# Patient Record
Sex: Female | Born: 1990 | Race: White | Hispanic: No | Marital: Married | Smoking: Never smoker
Health system: Southern US, Community
[De-identification: ages and names within clinical notes are randomized; demographics above are authoritative.]

## PROBLEM LIST (undated history)

## (undated) DIAGNOSIS — IMO0001 Reserved for inherently not codable concepts without codable children: Secondary | ICD-10-CM

## (undated) DIAGNOSIS — O039 Complete or unspecified spontaneous abortion without complication: Secondary | ICD-10-CM

## (undated) HISTORY — PX: MULTIPLE TOOTH EXTRACTIONS: SHX2053

---

## 2013-10-07 ENCOUNTER — Emergency Department (HOSPITAL_COMMUNITY)
Admission: EM | Admit: 2013-10-07 | Discharge: 2013-10-08 | Disposition: A | Discharge status: Home / Self Care | Payer: BC Managed Care – PPO | Attending: Emergency Medicine | Admitting: Emergency Medicine

## 2013-10-07 ENCOUNTER — Encounter (HOSPITAL_COMMUNITY): Payer: Self-pay | Admitting: Emergency Medicine

## 2013-10-07 DIAGNOSIS — N938 Other specified abnormal uterine and vaginal bleeding: Secondary | ICD-10-CM | POA: Insufficient documentation

## 2013-10-07 DIAGNOSIS — N949 Unspecified condition associated with female genital organs and menstrual cycle: Secondary | ICD-10-CM | POA: Insufficient documentation

## 2013-10-07 DIAGNOSIS — A499 Bacterial infection, unspecified: Secondary | ICD-10-CM | POA: Insufficient documentation

## 2013-10-07 DIAGNOSIS — Z3202 Encounter for pregnancy test, result negative: Secondary | ICD-10-CM | POA: Insufficient documentation

## 2013-10-07 DIAGNOSIS — N760 Acute vaginitis: Secondary | ICD-10-CM | POA: Insufficient documentation

## 2013-10-07 DIAGNOSIS — B9689 Other specified bacterial agents as the cause of diseases classified elsewhere: Secondary | ICD-10-CM | POA: Insufficient documentation

## 2013-10-07 DIAGNOSIS — N939 Abnormal uterine and vaginal bleeding, unspecified: Secondary | ICD-10-CM

## 2013-10-07 HISTORY — DX: Reserved for inherently not codable concepts without codable children: IMO0001

## 2013-10-07 HISTORY — DX: Complete or unspecified spontaneous abortion without complication: O03.9

## 2013-10-07 NOTE — ED Notes (Signed)
Patient is alert and oriented x3.  She states that she has been bleeding since she had an abortion on Oct 31.  She states that she is having cramping pains with nausea and dizziness.  Currently she rates her pain 7 of 10.  Today she passed a large mass that she has brought with her.

## 2013-10-08 ENCOUNTER — Emergency Department (HOSPITAL_COMMUNITY): Payer: BC Managed Care – PPO

## 2013-10-08 LAB — CBC WITH DIFFERENTIAL/PLATELET
Basophils Absolute: 0 10*3/uL (ref 0.0–0.1)
Basophils Relative: 1 % (ref 0–1)
Eosinophils Relative: 6 % — ABNORMAL HIGH (ref 0–5)
HCT: 38 % (ref 36.0–46.0)
Hemoglobin: 13.1 g/dL (ref 12.0–15.0)
Lymphocytes Relative: 34 % (ref 12–46)
Lymphs Abs: 2 10*3/uL (ref 0.7–4.0)
MCV: 88 fL (ref 78.0–100.0)
Monocytes Absolute: 0.6 10*3/uL (ref 0.1–1.0)
Monocytes Relative: 10 % (ref 3–12)
Neutro Abs: 3 10*3/uL (ref 1.7–7.7)
RBC: 4.32 MIL/uL (ref 3.87–5.11)
RDW: 12.3 % (ref 11.5–15.5)
WBC: 6 10*3/uL (ref 4.0–10.5)

## 2013-10-08 LAB — WET PREP, GENITAL
Trich, Wet Prep: NONE SEEN
Yeast Wet Prep HPF POC: NONE SEEN

## 2013-10-08 LAB — POCT PREGNANCY, URINE: Preg Test, Ur: NEGATIVE

## 2013-10-08 MED ORDER — METRONIDAZOLE 500 MG PO TABS
500.0000 mg | ORAL_TABLET | Freq: Once | ORAL | Status: AC
  Administered 2013-10-08: 500 mg via ORAL
  Filled 2013-10-08: qty 1

## 2013-10-08 MED ORDER — METRONIDAZOLE 500 MG PO TABS: 500.0000 mg | ORAL_TABLET | Freq: Two times a day (BID) | ORAL | Status: DC

## 2013-10-08 NOTE — ED Provider Notes (Signed)
CSN: [id number]     Arrival date & time 10/07/13  2210 History   First MD Initiated Contact with Patient 10/08/13 0121     Chief Complaint  Patient presents with  . Vaginal Bleeding   (Consider location/radiation/quality/duration/timing/severity/associated sxs/prior Treatment) HPI Comments: Patient had a therapeutic abortion on October 31 at that time.  She has had intermittent episodes of heavy bleeding.  Waxing and waning in intensity, but never totally, without bleeding was unable to keep her followup visit at 6 weeks.  She has an appointment in January.  Today, she developed severe abdominal cramping.  Has a large clumps of tissue, and the bleeding has stopped as well as the cramping.  Patient denies dizziness, shortness of breath with exertion, resumption of sexual intercourse, dysuria, vomiting, or diarrhea  Patient is a 22 y.o. female presenting with vaginal bleeding. The history is provided by the patient.  Vaginal Bleeding Quality:  Passed tissue and lighter than menses Severity:  Moderate Onset quality:  Gradual Duration:  6 weeks Timing:  Constant Progression:  Waxing and waning Chronicity:  New Menstrual history:  Regular Possible pregnancy: no   Context comment:  After therapeutic abortion October 31 Relieved by:  None tried Worsened by:  Nothing tried Ineffective treatments:  None tried Associated symptoms: abdominal pain   Associated symptoms: no fever and no nausea   Risk factors: terminated pregnancy   Risk factors: no STD and no STD exposure     Past Medical History  Diagnosis Date  . Abortion    History reviewed. No pertinent past surgical history. No family history on file. History  Substance Use Topics  . Smoking status: Never Smoker   . Smokeless tobacco: Not on file  . Alcohol Use: Yes   OB History   Grav Para Term Preterm Abortions TAB SAB Ect Mult Living                 Review of Systems  Constitutional: Negative for fever.  Respiratory:  Negative for shortness of breath.   Gastrointestinal: Positive for abdominal pain. Negative for nausea and vomiting.  Genitourinary: Positive for vaginal bleeding.  Musculoskeletal: Negative for myalgias.  All other systems reviewed and are negative.    Allergies  Review of patient's allergies indicates no known allergies.  Home Medications   Current Outpatient Rx  Name  Route  Sig  Dispense  Refill  . ibuprofen (ADVIL,MOTRIN) 200 MG tablet   Oral   Take 400 mg by mouth every 6 (six) hours as needed (pain).          BP 115/76  Pulse 85  Temp(Src) 98.3 F (36.8 C) (Oral)  Resp 18  Ht 5\' 3"  (1.6 m)  Wt 123 lb (55.792 kg)  BMI 21.79 kg/m2  SpO2 99%  LMP 09/17/2013 Physical Exam  Nursing note and vitals reviewed. Constitutional: She is oriented to person, place, and time. She appears well-developed and well-nourished.  HENT:  Head: Normocephalic.  Eyes: Pupils are equal, round, and reactive to light.  Neck: Normal range of motion.  Abdominal: Soft. She exhibits no distension. There is no tenderness.  Genitourinary: Vagina normal and uterus normal. No vaginal discharge found.  Small amount of, what appears to be old blood brownish in color in the vaginal vault.  Cervical os is closed, nontender  Musculoskeletal: Normal range of motion.  Neurological: She is alert and oriented to person, place, and time.  Skin: Skin is warm.    ED Course  Procedures (including critical care  time) Labs Review Labs Reviewed  WET PREP, GENITAL - Abnormal; Notable for the following:    Clue Cells Wet Prep HPF POC MODERATE (*)    WBC, Wet Prep HPF POC MODERATE (*)    All other components within normal limits  CBC WITH DIFFERENTIAL - Abnormal; Notable for the following:    Eosinophils Relative 6 (*)    All other components within normal limits  GC/CHLAMYDIA PROBE AMP  POCT PREGNANCY, URINE   Imaging Review US Transvaginal Non-ob  10/08/2013   CLINICAL DATA:  Vaginal bleeding.   Prior abortion on 08/15/2013.  EXAM: TRANSABDOMINAL AND TRANSVAGINAL ULTRASOUND OF PELVIS  TECHNIQUE: Both transabdominal and transvaginal ultrasound examinations of the pelvis were performed. Transabdominal technique was performed for global imaging of the pelvis including uterus, ovaries, adnexal regions, and pelvic cul-de-sac. It was necessary to proceed with endovaginal exam following the transabdominal exam to visualize the uterus and ovaries in greater detail.  COMPARISON:  None  FINDINGS: Uterus  Measurements: 7.0 x 2.4 x 3.6 cm. No fibroids or other mass visualized.  Endometrium  Thickness: 0.4 cm.  No focal abnormality visualized.  Right ovary  Measurements: 3.2 x 1.4 x 1.9 cm. Normal appearance/no adnexal mass.  Left ovary  Measurements: 3.7 x 1.9 x 1.5 cm. Normal appearance/no adnexal mass.  Other findings  Trace free fluid is noted within the pelvic cul-de-sac.  IMPRESSION: Unremarkable pelvic ultrasound. No evidence for retained products of conception.   Electronically Signed   By: Roanna Raider M.D.   On: 10/08/2013 02:53   US Pelvis Complete  10/08/2013   CLINICAL DATA:  Vaginal bleeding.  Prior abortion on 08/15/2013.  EXAM: TRANSABDOMINAL AND TRANSVAGINAL ULTRASOUND OF PELVIS  TECHNIQUE: Both transabdominal and transvaginal ultrasound examinations of the pelvis were performed. Transabdominal technique was performed for global imaging of the pelvis including uterus, ovaries, adnexal regions, and pelvic cul-de-sac. It was necessary to proceed with endovaginal exam following the transabdominal exam to visualize the uterus and ovaries in greater detail.  COMPARISON:  None  FINDINGS: Uterus  Measurements: 7.0 x 2.4 x 3.6 cm. No fibroids or other mass visualized.  Endometrium  Thickness: 0.4 cm.  No focal abnormality visualized.  Right ovary  Measurements: 3.2 x 1.4 x 1.9 cm. Normal appearance/no adnexal mass.  Left ovary  Measurements: 3.7 x 1.9 x 1.5 cm. Normal appearance/no adnexal mass.  Other  findings  Trace free fluid is noted within the pelvic cul-de-sac.  IMPRESSION: Unremarkable pelvic ultrasound. No evidence for retained products of conception.   Electronically Signed   By: Roanna Raider M.D.   On: 10/08/2013 02:53    EKG Interpretation   None       MDM   1. Vaginal bleeding, abnormal   2. Bacterial vaginosis    Is on Flagyl recommended.  The patient.  Followup with her OB/GYN as scheduled in January    Arman Filter, NP 10/08/13 [phone]

## 2013-10-08 NOTE — ED Provider Notes (Signed)
Medical screening examination/treatment/procedure(s) were conducted as a shared visit with non-physician practitioner(s) or resident  and myself.  I personally evaluated the patient during the encounter and agree with the findings and plan unless otherwise indicated.    I have personally reviewed any xrays and/ or EKG's with the provider and I agree with interpretation.   Worsening bleeding since abortion.  No fevers.  Passed large blood clot tonight.  Mild lightheaded.  No syncope.  Mild lower abdominal cramping.  No sexual activity since abortion.  Well appearing, abd soft/ NT/ ND.   Fup outpt discussed. [place] no acute findings.  Vaginal bleeding, BV  Enid Skeens, MD 10/08/13 [phone]

## 2014-01-09 ENCOUNTER — Encounter (HOSPITAL_COMMUNITY): Payer: Self-pay | Admitting: Emergency Medicine

## 2014-01-09 ENCOUNTER — Emergency Department (HOSPITAL_COMMUNITY)
Admission: EM | Admit: 2014-01-09 | Discharge: 2014-01-10 | Disposition: A | Discharge status: Home / Self Care | Payer: BC Managed Care – PPO | Attending: Emergency Medicine | Admitting: Emergency Medicine

## 2014-01-09 ENCOUNTER — Emergency Department (HOSPITAL_COMMUNITY): Payer: BC Managed Care – PPO

## 2014-01-09 DIAGNOSIS — Y939 Activity, unspecified: Secondary | ICD-10-CM | POA: Insufficient documentation

## 2014-01-09 DIAGNOSIS — Z792 Long term (current) use of antibiotics: Secondary | ICD-10-CM | POA: Insufficient documentation

## 2014-01-09 DIAGNOSIS — S0990XA Unspecified injury of head, initial encounter: Secondary | ICD-10-CM

## 2014-01-09 DIAGNOSIS — Y929 Unspecified place or not applicable: Secondary | ICD-10-CM | POA: Insufficient documentation

## 2014-01-09 DIAGNOSIS — W64XXXA Exposure to other animate mechanical forces, initial encounter: Secondary | ICD-10-CM | POA: Insufficient documentation

## 2014-01-09 MED ORDER — ACETAMINOPHEN 325 MG PO TABS
650.0000 mg | ORAL_TABLET | Freq: Once | ORAL | Status: AC
  Administered 2014-01-09: 650 mg via ORAL
  Filled 2014-01-09: qty 2

## 2014-01-09 NOTE — ED Provider Notes (Signed)
CSN: [id number]     Arrival date & time 01/09/14  2300 History   First MD Initiated Contact with Patient 01/09/14 2335     Chief Complaint  Patient presents with  . Head Injury    HPI  Sonya Cruz is a 23 y.o. female with a PMH of abortion who presents to the ED for evaluation of head injury. History was provided by the patient.  Patient states that around 9:30 PM this evening she was kicked in the head by a horse. States she slipped in the mud and her horse took off and she subsequently got kicked. Denies LOC. Has mild nausea and a 3/10 throbbing constant headache to her forehead. Photophobia with no vision changes. No weakness, confusion, loss of sensation, numbness/tingling. No other injuries. No neck pain, chest pain, SOB, abdominal pain, emesis, back pain. Patient took an Ibuprofen PTA. Does not take blood thinners. Tetanus up to date.    Past Medical History  Diagnosis Date  . Abortion    History reviewed. No pertinent past surgical history. History reviewed. No pertinent family history. History  Substance Use Topics  . Smoking status: Never Smoker   . Smokeless tobacco: Not on file  . Alcohol Use: Yes   OB History   Grav Para Term Preterm Abortions TAB SAB Ect Mult Living                 Review of Systems  Constitutional: Negative for fatigue.  HENT: Positive for facial swelling (forehead). Negative for ear pain and sinus pressure.   Eyes: Positive for photophobia. Negative for pain and visual disturbance.  Respiratory: Negative for cough and shortness of breath.   Cardiovascular: Negative for chest pain and leg swelling.  Gastrointestinal: Positive for nausea. Negative for vomiting and abdominal pain.  Musculoskeletal: Negative for back pain, gait problem and neck pain.  Skin: Positive for wound.  Neurological: Positive for headaches. Negative for dizziness, syncope, facial asymmetry, speech difficulty, weakness, light-headedness and numbness.   Psychiatric/Behavioral: Negative for confusion.     Allergies  Review of patient's allergies indicates no known allergies.  Home Medications   Current Outpatient Rx  Name  Route  Sig  Dispense  Refill  . ibuprofen (ADVIL,MOTRIN) 200 MG tablet   Oral   Take 400 mg by mouth every 6 (six) hours as needed (pain).         . metroNIDAZOLE (FLAGYL) 500 MG tablet   Oral   Take 1 tablet (500 mg total) by mouth 2 (two) times daily.   13 tablet   0    BP 116/79  Pulse 60  Temp(Src) 97.6 F (36.4 C) (Oral)  Ht 5\' 3"  (1.6 m)  Wt 123 lb (55.792 kg)  BMI 21.79 kg/m2  SpO2 99%  LMP 12/16/2013  Filed Vitals:   01/09/14 2330 01/10/14 0126  BP: 116/79 109/84  Pulse: 60 66  Temp: 97.6 F (36.4 C)   TempSrc: Oral   Resp:  18  Height: 5\' 3"  (1.6 m)   Weight: 123 lb (55.792 kg)   SpO2: 99% 100%    Physical Exam  Nursing note and vitals reviewed. Constitutional: She is oriented to person, place, and time. She appears well-developed and well-nourished. No distress.  HENT:  Head: Normocephalic.    Right Ear: External ear normal.  Left Ear: External ear normal.  Nose: Nose normal.  Mouth/Throat: Oropharynx is clear and moist. No oropharyngeal exudate.  Large soft hematoma to the middle-left forehead which is tender to palpation  with ecchymosis. Abrasions overlying the middle of the hematoma with no open wounds. No palpable foreign bodies. Unable to visualize TM's due to cerumen impaction. No other facial edema or tenderness throughout  Eyes: Conjunctivae and EOM are normal. Pupils are equal, round, and reactive to light. Right eye exhibits no discharge. Left eye exhibits no discharge.  Neck: Normal range of motion. Neck supple.  No cervical spinal or paraspinal tenderness to palpation throughout.  No limitations with neck ROM.    Cardiovascular: Normal rate, regular rhythm, normal heart sounds and intact distal pulses.  Exam reveals no gallop and no friction rub.   No murmur  heard. Dorsalis pedis pulses present and equal bilaterally  Pulmonary/Chest: Effort normal and breath sounds normal. No respiratory distress. She has no wheezes. She has no rales. She exhibits no tenderness.  Abdominal: Soft. She exhibits no distension and no mass. There is no tenderness. There is no rebound and no guarding.  Musculoskeletal: Normal range of motion. She exhibits no edema and no tenderness.  No tenderness to palpation to the UE or LE throughout. Strength 5/5 in the upper and lower extremities bilaterally. No tenderness to palpation to the thoracic or lumbar spinous processes throughout.  No tenderness to palpation to the paraspinal muscles throughout. Patient able to ambulate without difficulty or ataxia.   Neurological: She is alert and oriented to person, place, and time.  GCS 15.  No focal neurological deficits.  CN 2-12 intact.  No pronator drift.  Finger to nose intact.  Heel to shin intact.    Skin: Skin is warm and dry. She is not diaphoretic.    ED Course  Procedures (including critical care time) Labs Review Labs Reviewed - No data to display Imaging Review No results found.   EKG Interpretation None          CT Head Wo Contrast (Final result)  Result time: 01/10/14 00:46:39    Final result by Rad Results In Interface (01/10/14 00:46:39)    Narrative:   CLINICAL DATA: Head injury  EXAM: CT HEAD WITHOUT CONTRAST  TECHNIQUE: Contiguous axial images were obtained from the base of the skull through the vertex without intravenous contrast.  COMPARISON: None.  FINDINGS: There is no acute intracranial hemorrhage or infarct. No mass lesion or midline shift. Gray-white matter differentiation is well maintained. Ventricles are normal in size without evidence of hydrocephalus. CSF containing spaces are within normal limits. No extra-axial fluid collection.  The calvarium is intact.  Orbital soft tissues are within normal limits.  The paranasal sinuses  and mastoid air cells are well pneumatized and free of fluid.  Left frontal scalp hematoma is present. Retained metallic density measuring 4 mm present at the site of contusion.  IMPRESSION: 1. No acute intracranial process. 2. Left frontal scalp contusion. Retained 4 mm metallic density in the scalp soft tissues at the site of contusion.   Electronically Signed By: Rise MuBenjamin McClintock M.D. On: 01/10/2014 00:46         MDM   Sonya Cruz is a 23 y.o. female with a PMH of abortion who presents to the ED for evaluation of head injury.   Rechecks  1:00 AM = Patient states that 1 year ago she had a foreign body (pin) punctured into her scalp in her middle forehead, which was never recovered. Pain controlled. Asking for something for nausea. Zofran ordered. Hematoma stable with no enlargement.     Patient evaluated for head injury. Head CT negative for an acute intracranial abnormality.  No neurological deficits. Foreign body incidentally found which does not appear to be an acute finding. No palpable mass. No open lacerations which could have led to the foreign body insertion today. Patient has hx of foreign body in the forehead in the past, which is likely the foreign body seen on scan today. Patient informed of foreign body results. Patient given had injury precautions. Instructed to ice area. Return precautions, discharge instructions, and follow-up was discussed with the patient before discharge.     Discharge Medication List as of 01/10/2014  1:19 AM    START taking these medications   Details  HYDROcodone-acetaminophen (NORCO/VICODIN) 5-325 MG per tablet Take 1 tablet by mouth every 4 (four) hours as needed., Starting 01/10/2014, Until Discontinued, Print    ondansetron (ZOFRAN ODT) 4 MG disintegrating tablet Take 1 tablet (4 mg total) by mouth every 8 (eight) hours as needed for nausea., Starting 01/10/2014, Until Discontinued, Print         Final impressions: 1. Head  injury      Luiz Iron PA-C   This patient was discussed with Dr. Robyn Haber, PA-C 01/10/14 1140

## 2014-01-09 NOTE — ED Notes (Signed)
Patient is alert and oriented x3.  She is complaining of a horse kick to the head. She was attempting to feed a horse and the horse pivoted on her and she fell in  Mud and the horse kicked her in the forehead.  Currently she rates her pain 3 of 10 With nausea.

## 2014-01-10 MED ORDER — ONDANSETRON 4 MG PO TBDP: 4.0000 mg | ORAL_TABLET | Freq: Three times a day (TID) | ORAL | Status: DC | PRN

## 2014-01-10 MED ORDER — HYDROCODONE-ACETAMINOPHEN 5-325 MG PO TABS: 1.0000 | ORAL_TABLET | ORAL | Status: DC | PRN

## 2014-01-10 MED ORDER — ONDANSETRON 4 MG PO TBDP
4.0000 mg | ORAL_TABLET | Freq: Once | ORAL | Status: AC
  Administered 2014-01-10: 4 mg via ORAL
  Filled 2014-01-10: qty 1

## 2014-01-10 NOTE — Discharge Instructions (Signed)
Ice and cold compresses  Take Tylenol for mild-moderate pain  Take Vicodin for severe pain - Please be careful with this medication.  It can cause drowsiness.  Use caution while driving, operating machinery, drinking alcohol, or any other activities that may impair your physical or mental abilities.   Return to the emergency department if you develop any changing/worsening condition, confusion, weakness, loss of sensation, vomiting, vision changes, or any other concerns (please read additional information regarding your condition below)    Head Injury, Adult You have received a head injury. It does not appear serious at this time. Headaches and vomiting are common following head injury. It should be easy to awaken from sleeping. Sometimes it is necessary for you to stay in the emergency department for a while for observation. Sometimes admission to the hospital may be needed. After injuries such as yours, most problems occur within the first 24 hours, but side effects may occur up to 7 10 days after the injury. It is important for you to carefully monitor your condition and contact your health care provider or seek immediate medical care if there is a change in your condition. WHAT ARE THE TYPES OF HEAD INJURIES? Head injuries can be as minor as a bump. Some head injuries can be more severe. More severe head injuries include:  A jarring injury to the brain (concussion).  A bruise of the brain (contusion). This mean there is bleeding in the brain that can cause swelling.  A cracked skull (skull fracture).  Bleeding in the brain that collects, clots, and forms a bump (hematoma). WHAT CAUSES A HEAD INJURY? A serious head injury is most likely to happen to someone who is in a car wreck and is not wearing a seat belt. Other causes of major head injuries include bicycle or motorcycle accidents, sports injuries, and falls. HOW ARE HEAD INJURIES DIAGNOSED? A complete history of the event leading to the  injury and your current symptoms will be helpful in diagnosing head injuries. Many times, pictures of the brain, such as CT or MRI are needed to see the extent of the injury. Often, an overnight hospital stay is necessary for observation.  WHEN SHOULD I SEEK IMMEDIATE MEDICAL CARE?  You should get help right away if:  You have confusion or drowsiness.  You feel sick to your stomach (nauseous) or have continued, forceful vomiting.  You have dizziness or unsteadiness that is getting worse.  You have severe, continued headaches not relieved by medicine. Only take over-the-counter or prescription medicines for pain, fever, or discomfort as directed by your health care provider.  You do not have normal function of the arms or legs or are unable to walk.  You notice changes in the black spots in the center of the colored part of your eye (pupil).  You have a clear or bloody fluid coming from your nose or ears.  You have a loss of vision. During the next 24 hours after the injury, you must stay with someone who can watch you for the warning signs. This person should contact local emergency services (911 in the [place] if you have seizures, you become unconscious, or you are unable to wake up. HOW CAN I PREVENT A HEAD INJURY IN THE FUTURE? The most important factor for preventing major head injuries is avoiding motor vehicle accidents. To minimize the potential for damage to your head, it is crucial to wear seat belts while riding in motor vehicles. Wearing helmets while bike riding and playing  collision sports (like football) is also helpful. Also, avoiding dangerous activities around the house will further help reduce your risk of head injury.  WHEN CAN I RETURN TO NORMAL ACTIVITIES AND ATHLETICS? You should be reevaluated by your health care provider before returning to these activities. If you have any of the following symptoms, you should not return to activities or contact sports until 1 week  after the symptoms have stopped:  Persistent headache.  Dizziness or vertigo.  Poor attention and concentration.  Confusion.  Memory problems.  Nausea or vomiting.  Fatigue or tire easily.  Irritability.  Intolerant of bright lights or loud noises.  Anxiety or depression.  Disturbed sleep. MAKE SURE YOU:   Understand these instructions.  Will watch your condition.  Will get help right away if you are not doing well or get worse. Document Released: 10/02/2005 Document Revised: 07/23/2013 Document Reviewed: 06/09/2013 [facility] Patient Information 2014 [place], [place].   Emergency Department Resource Guide 1) Find a Doctor and Pay Out of Pocket Although you won't have to find out who is covered by your insurance plan, it is a good idea to ask around and get recommendations. You will then need to call the office and see if the doctor you have chosen will accept you as a new patient and what types of options they offer for patients who are self-pay. Some doctors offer discounts or will set up payment plans for their patients who do not have insurance, but you will need to ask so you aren't surprised when you get to your appointment.  2) Contact Your Local Health Department Not all health departments have doctors that can see patients for sick visits, but many do, so it is worth a call to see if yours does. If you don't know where your local health department is, you can check in your phone book. The CDC also has a tool to help you locate your state's health department, and many state websites also have listings of all of their local health departments.  3) Find a Walk-in Clinic If your illness is not likely to be very severe or complicated, you may want to try a walk in clinic. These are popping up all over the country in pharmacies, drugstores, and shopping centers. They're usually staffed by nurse practitioners or physician assistants that have been trained to treat common  illnesses and complaints. They're usually fairly quick and inexpensive. However, if you have serious medical issues or chronic medical problems, these are probably not your best option.  No Primary Care Doctor: - Call Health Connect at  [phone] - they can help you locate a primary care doctor that  accepts your insurance, provides certain services, etc. - Physician Referral Service- [phone]  Chronic Pain Problems: Organization         Address  Phone   Notes  [facility]  [phone] Patients need to be referred by their primary care doctor.   Medication Assistance: Organization         Address  Phone   Notes  [facility] [address] [place], [place] [address] [phone] --Must be a resident of [place] -- Must have NO insurance coverage whatsoever (no Medicaid/ Medicare, etc.) -- The pt. MUST have a primary care doctor that directs their care regularly and follows them in the community   [facility]ist  [phone]   [employer]  [phone]    Agencies that provide inexpensive  medical care: Organization         Address  Phone   Notes  [facility]  [phone]   [facility]    [phone]   [facility] [address] [place], [place] [address] [phone]   [facility] [address] [place] [place] [phone]   Planned Parenthood    [phone]   [facility]    [phone]   [facility]  [address], [place] Phone:  [phone], Fax:  [phone] Hours of Operation:  9 am - 6 pm, M-F.  Also accepts Medicaid/Medicare and self-pay.  [facility]  [address], [place] Phone: [phone], Fax: [phone]. Hours of Operation:  8:30 am - 5:30 pm, M-F.  Also accepts Medicaid and self-pay.  [facility] [place]  [address] [place] Phone: [phone]   [facility] [address] Natasha Bence [place], [place] [phone], Ext. [phone] Mondays & Thursdays: 7-9 AM.  First 15 patients are seen on a first come, first serve basis.    Medicaid-accepting [facility] Providers:  Organization         Address  Phone   Notes  [facility] [address],  [phone] Also accepts self-pay patients.  [facility] [address] Laurell Josephs [place], [place]  [phone]   [facility] [address], [place] [phone]   [facility] [address] [place] [phone]   Renaye Rakers [address], [place]   [phone] Only accepts [place] patients after they have their name applied to their card.   Self-Pay (no insurance) in [facility]:  Organization         Address  Phone   Notes  Sickle Cell Patients, [facility] [address] [place], [place] [phone]   [facility] [address] [place], [place] [phone]   [facility] [place]  [address], [place] [phone]   [facility]/Dr. Osei-Bonsu  [address] [place] or [address], [place] [phone] Phone number for both [place] and [place] locations is the same.  [facility]gent [facility] [address] [place] [phone]   [facility] [address] [place] or [address] [phone]   [facility] [address] [place] [phone], phone; [phone], fax Sees patients 1st and 3rd Saturday of every month.  Must not qualify for public or private insurance (i.e. Medicaid, Medicare, [place] Health Choice, Veterans' Benefits)  Household income should be no more than 200% of the  poverty level The clinic cannot treat you if you are pregnant or think you are pregnant  Sexually transmitted diseases are not treated at the clinic.    Dental Care: Organization         Address  Phone  Notes  [facility] [address] [place], [place] [phone] Accepts children up to age 65 who are enrolled in [place] or [place] Health Choice; pregnant women with a Medicaid card; and children who have applied for Medicaid or [place] Health Choice, but  were declined, whose parents can pay a reduced fee at time of service.  [facility] Department of [facility]  [address], [place] [phone] Accepts children up to age 26 who are enrolled in [place] or [place] Health Choice; pregnant women with a Medicaid card; and children who have applied for Medicaid or [place] Health Choice, but were declined, whose parents can pay a reduced fee at time of service.  [facility]  [address] [place], [place] [phone] Patients are seen by appointment only. Walk-ins are not accepted. [facility] will see patients 7 years of age and older. Monday - Tuesday (8am-5pm) Most Wednesdays (8:30-5pm) $30 per visit, cash only  [facility] Adult Dental Access PRO[facility]  [address], [facility] [phone] Patients are seen by appointment only. Walk-ins are not accepted. [facility] will see patients 61 years of age and older. One Wednesday Evening (Monthly: Volunteer Based).  $30 per visit, cash only  [employer]  [phone] for adults; Children under age 10, call Graduate Pediatric Dentistry at [phone]. Children aged 73-14, please call [phone] to request a pediatric application.  Dental services are provided in all areas of dental care including fillings, crowns and bridges, complete and partial dentures, implants, gum treatment, root canals, and extractions.  Preventive care is also provided. Treatment is provided to both adults and children. Patients are selected via a lottery and there is often a waiting list.   [facility] [address] [place]  [phone] [email or web address]   [facility] [address] [place] [address] [place], [place] [phone], Ext. [phone] Second and Fourth Thursday of each month, opens at 6:30 AM; Clinic ends at 9 AM.  Patients are seen on a first-come first-served basis, and a limited number are seen during each clinic.   [facility]  [address] Ether Griffins [place], [place] [phone]   Eligibility Requirements You must have lived in [place], [place], or [place] counties for at least the last three months.   You cannot be eligible for state or federal sponsored [place], including [employer], [place], or [employer].   You generally cannot be eligible for healthcare insurance through your employer.    How to apply: Eligibility screenings are held every Tuesday and Wednesday afternoon from 1:00 pm until 4:00 pm. You do not need an appointment for the interview!  [facility] [address], [place], [place] [phone]   [facility] Health Department  [phone]   [facility] Health Department  [phone]   [facility] Health Department  [phone]    Behavioral Health Resources in the Community: Intensive Outpatient Programs Organization         Address  Phone  Notes  [facility] [address] [place], [place] [phone]   [facility] Outpatient [address] [place], [place] [phone]   [facility] [address], [place], [place]  [phone]   [facility] [address]  [place], [place] [phone] or [phone]   Substance Abuse Resources Organization         Address  Phone  Notes  Alcohol and Drug Services  [phone]   Addiction  [facility]  [phone]   [facility] [place]  [phone]   Floydene Flock  [phone]   Residential & Outpatient Substance Abuse Program  [phone]   Psychological Services  Organization         Address  Phone  Notes  [employer] [facility]  [phone]   [facility]  [phone]   [facility] Mental [facility] [address], [place] [phone] or [phone]    Mobile Crisis Teams Organization         Address  Phone  Notes  [facility], Mobile Crisis Care Unit  [phone]   [facility]  [address] [place], [place] [phone]   Doristine Locks [address] [place] [phone]    Self-Help/Support Groups Organization         Address  Phone             Notes  [facility] [place] - variety of support groups  336- I7437963 Call for more information  Narcotics Anonymous (NA), [facility] [address], [employer] [place]  2 meetings at this location   Statistician         Address  Phone  Notes  [facility] [address],    [place]  [phone]   [facility]  [address], [place] [address], [place], [place] [phone]   [facility] [address] [place], [place] [phone] Admissions: 8am-3pm M-F  [facility] [address]    [place], [place] [phone]   [facility] [address] [place], [place], [place] [phone]   [facility] [address],  [place], [place] [phone]   [facility] - Intensive Outpatient [address] Laurell Joseph[address], [place], [place] [phone]   [facility] ([facility] [address]  [place], [place] [phone] or [phone]   [facility] ([facility]) [address], [place], [place] [phone] Accepts Medicaid  [facility] [address]  [place]  [phone] Substance Abuse/Addiction Treatment   [facility] Organization         Address  Phone  Notes  [facility]  [phone]   Angie Fava, PhD [address] Ervin Knack [place], [place]   [phone] or [phone]   [facility] Behavioral   [address] [place], [place] [phone]   [facility] [address], [place], [place] [phone] Insurance/Medicaid/sponsorship through [facility] and Families [address]                                    [place], [place] [phone] Therapy/tele-psych/case  [facility] [address], [place] [phone]    Dr. Lolly Mustache  [phone]   [facility]  [employer] [facility] Dept. 1) [address], [place] 2) [address] [place] [address], [place] 3)  [address], [place] [phone]  [phone]   [facility] Child Abuse Hotline 3377707547 or [phone] (After Hours)

## 2014-01-11 NOTE — ED Provider Notes (Signed)
Medical screening examination/treatment/procedure(s) were conducted as a shared visit with non-physician practitioner(s) and myself.  I personally evaluated the patient during the encounter.   EKG Interpretation None      Well appearing. FB noted on CT but likely chronic from her prior injury years ago. No mechanism to suggest this is from the current injury which was more blunt in nature.   Lyanne CoKevin M Kimbly Eanes, MD 01/11/14 0500

## 2017-07-17 LAB — CHG URINE PREGNANCY TEST: Preg Test, Ur: POSITIVE

## 2017-07-27 ENCOUNTER — Encounter: Payer: Self-pay | Admitting: *Deleted

## 2017-08-20 ENCOUNTER — Ambulatory Visit (INDEPENDENT_AMBULATORY_CARE_PROVIDER_SITE_OTHER): Admitting: Obstetrics and Gynecology

## 2017-08-20 ENCOUNTER — Encounter: Payer: Self-pay | Admitting: Obstetrics and Gynecology

## 2017-08-20 DIAGNOSIS — O9989 Other specified diseases and conditions complicating pregnancy, childbirth and the puerperium: Secondary | ICD-10-CM

## 2017-08-20 DIAGNOSIS — Z113 Encounter for screening for infections with a predominantly sexual mode of transmission: Secondary | ICD-10-CM

## 2017-08-20 DIAGNOSIS — Z3481 Encounter for supervision of other normal pregnancy, first trimester: Secondary | ICD-10-CM

## 2017-08-20 DIAGNOSIS — Z3480 Encounter for supervision of other normal pregnancy, unspecified trimester: Secondary | ICD-10-CM

## 2017-08-20 DIAGNOSIS — R8271 Bacteriuria: Secondary | ICD-10-CM

## 2017-08-20 DIAGNOSIS — O99891 Other specified diseases and conditions complicating pregnancy: Secondary | ICD-10-CM

## 2017-08-20 LAB — POCT URINALYSIS DIP (DEVICE)
BILIRUBIN URINE: NEGATIVE
GLUCOSE, UA: NEGATIVE mg/dL
Hgb urine dipstick: NEGATIVE
KETONES UR: NEGATIVE mg/dL
Leukocytes, UA: NEGATIVE
Nitrite: POSITIVE — AB
PH: 5.5 (ref 5.0–8.0)
PROTEIN: NEGATIVE mg/dL
SPECIFIC GRAVITY, URINE: 1.025 (ref 1.005–1.030)
Urobilinogen, UA: 0.2 mg/dL (ref 0.0–1.0)

## 2017-08-20 NOTE — Patient Instructions (Addendum)
First Trimester of Pregnancy The first trimester of pregnancy is from week 1 until the end of week 13 (months 1 through 3). A week after a sperm fertilizes an egg, the egg will implant on the wall of the uterus. This embryo will begin to develop into a baby. Genes from you and your partner will form the baby. The female genes will determine whether the baby will be a boy or a girl. At 6-8 weeks, the eyes and face will be formed, and the heartbeat can be seen on ultrasound. At the end of 12 weeks, all the baby's organs will be formed. Now that you are pregnant, you will want to do everything you can to have a healthy baby. Two of the most important things are to get good prenatal care and to follow your health care provider's instructions. Prenatal care is all the medical care you receive before the baby's birth. This care will help prevent, find, and treat any problems during the pregnancy and childbirth. Body changes during your first trimester Your body goes through many changes during pregnancy. The changes vary from woman to woman.  You may gain or lose a couple of pounds at first.  You may feel sick to your stomach (nauseous) and you may throw up (vomit). If the vomiting is uncontrollable, call your health care provider.  You may tire easily.  You may develop headaches that can be relieved by medicines. All medicines should be approved by your health care provider.  You may urinate more often. Painful urination may mean you have a bladder infection.  You may develop heartburn as a result of your pregnancy.  You may develop constipation because certain hormones are causing the muscles that push stool through your intestines to slow down.  You may develop hemorrhoids or swollen veins (varicose veins).  Your breasts may begin to grow larger and become tender. Your nipples may stick out more, and the tissue that surrounds them (areola) may become darker.  Your gums may bleed and may be  sensitive to brushing and flossing.  Dark spots or blotches (chloasma, mask of pregnancy) may develop on your face. This will likely fade after the baby is born.  Your menstrual periods will stop.  You may have a loss of appetite.  You may develop cravings for certain kinds of food.  You may have changes in your emotions from day to day, such as being excited to be pregnant or being concerned that something may go wrong with the pregnancy and baby.  You may have more vivid and strange dreams.  You may have changes in your hair. These can include thickening of your hair, rapid growth, and changes in texture. Some women also have hair loss during or after pregnancy, or hair that feels dry or thin. Your hair will most likely return to normal after your baby is born.  What to expect at prenatal visits During a routine prenatal visit:  You will be weighed to make sure you and the baby are growing normally.  Your blood pressure will be taken.  Your abdomen will be measured to track your baby's growth.  The fetal heartbeat will be listened to between weeks 10 and 14 of your pregnancy.  Test results from any previous visits will be discussed.  Your health care provider may ask you:  How you are feeling.  If you are feeling the baby move.  If you have had any abnormal symptoms, such as leaking fluid, bleeding, severe headaches,   or abdominal cramping.  If you are using any tobacco products, including cigarettes, chewing tobacco, and electronic cigarettes.  If you have any questions.  Other tests that may be performed during your first trimester include:  Blood tests to find your blood type and to check for the presence of any previous infections. The tests will also be used to check for low iron levels (anemia) and protein on red blood cells (Rh antibodies). Depending on your risk factors, or if you previously had diabetes during pregnancy, you may have tests to check for high blood  sugar that affects pregnant women (gestational diabetes).  Urine tests to check for infections, diabetes, or protein in the urine.  An ultrasound to confirm the proper growth and development of the baby.  Fetal screens for spinal cord problems (spina bifida) and Down syndrome.  HIV (human immunodeficiency virus) testing. Routine prenatal testing includes screening for HIV, unless you choose not to have this test.  You may need other tests to make sure you and the baby are doing well.  Follow these instructions at home: Medicines  Follow your health care provider's instructions regarding medicine use. Specific medicines may be either safe or unsafe to take during pregnancy.  Take a prenatal vitamin that contains at least 600 micrograms (mcg) of folic acid.  If you develop constipation, try taking a stool softener if your health care provider approves. Eating and drinking  Eat a balanced diet that includes fresh fruits and vegetables, whole grains, good sources of protein such as meat, eggs, or tofu, and low-fat dairy. Your health care provider will help you determine the amount of weight gain that is right for you.  Avoid raw meat and uncooked cheese. These carry germs that can cause birth defects in the baby.  Eating four or five small meals rather than three large meals a day may help relieve nausea and vomiting. If you start to feel nauseous, eating a few soda crackers can be helpful. Drinking liquids between meals, instead of during meals, also seems to help ease nausea and vomiting.  Limit foods that are high in fat and processed sugars, such as fried and sweet foods.  To prevent constipation: ? Eat foods that are high in fiber, such as fresh fruits and vegetables, whole grains, and beans. ? Drink enough fluid to keep your urine clear or pale yellow. Activity  Exercise only as directed by your health care provider. Most women can continue their usual exercise routine during  pregnancy. Try to exercise for 30 minutes at least 5 days a week. Exercising will help you: ? Control your weight. ? Stay in shape. ? Be prepared for labor and delivery.  Experiencing pain or cramping in the lower abdomen or lower back is a good sign that you should stop exercising. Check with your health care provider before continuing with normal exercises.  Try to avoid standing for long periods of time. Move your legs often if you must stand in one place for a long time.  Avoid heavy lifting.  Wear low-heeled shoes and practice good posture.  You may continue to have sex unless your health care provider tells you not to. Relieving pain and discomfort  Wear a good support bra to relieve breast tenderness.  Take warm sitz baths to soothe any pain or discomfort caused by hemorrhoids. Use hemorrhoid cream if your health care provider approves.  Rest with your legs elevated if you have leg cramps or low back pain.  If you develop   varicose veins in your legs, wear support hose. Elevate your feet for 15 minutes, 3-4 times a day. Limit salt in your diet. Prenatal care  Schedule your prenatal visits by the twelfth week of pregnancy. They are usually scheduled monthly at first, then more often in the last 2 months before delivery.  Write down your questions. Take them to your prenatal visits.  Keep all your prenatal visits as told by your health care provider. This is important. Safety  Wear your seat belt at all times when driving.  Make a list of emergency phone numbers, including numbers for family, friends, the hospital, and police and fire departments. General instructions  Ask your health care provider for a referral to a local prenatal education class. Begin classes no later than the beginning of month 6 of your pregnancy.  Ask for help if you have counseling or nutritional needs during pregnancy. Your health care provider can offer advice or refer you to specialists for help  with various needs.  Do not use hot tubs, steam rooms, or saunas.  Do not douche or use tampons or scented sanitary pads.  Do not cross your legs for long periods of time.  Avoid cat litter boxes and soil used by cats. These carry germs that can cause birth defects in the baby and possibly loss of the fetus by miscarriage or stillbirth.  Avoid all smoking, herbs, alcohol, and medicines not prescribed by your health care provider. Chemicals in these products affect the formation and growth of the baby.  Do not use any products that contain nicotine or tobacco, such as cigarettes and e-cigarettes. If you need help quitting, ask your health care provider. You may receive counseling support and other resources to help you quit.  Schedule a dentist appointment. At home, brush your teeth with a soft toothbrush and be gentle when you floss. Contact a health care provider if:  You have dizziness.  You have mild pelvic cramps, pelvic pressure, or nagging pain in the abdominal area.  You have persistent nausea, vomiting, or diarrhea.  You have a bad smelling vaginal discharge.  You have pain when you urinate.  You notice increased swelling in your face, hands, legs, or ankles.  You are exposed to fifth disease or chickenpox.  You are exposed to German measles (rubella) and have never had it. Get help right away if:  You have a fever.  You are leaking fluid from your vagina.  You have spotting or bleeding from your vagina.  You have severe abdominal cramping or pain.  You have rapid weight gain or loss.  You vomit blood or material that looks like coffee grounds.  You develop a severe headache.  You have shortness of breath.  You have any kind of trauma, such as from a fall or a car accident. Summary  The first trimester of pregnancy is from week 1 until the end of week 13 (months 1 through 3).  Your body goes through many changes during pregnancy. The changes vary from  woman to woman.  You will have routine prenatal visits. During those visits, your health care provider will examine you, discuss any test results you may have, and talk with you about how you are feeling. This information is not intended to replace advice given to you by your health care provider. Make sure you discuss any questions you have with your health care provider. Document Released: 09/26/2001 Document Revised: 09/13/2016 Document Reviewed: 09/13/2016 Elsevier Interactive Patient Education  2017 Elsevier   Inc.   First Trimester scan by 12 13 6/7 weeks Quad Screen 15 -20 weeks Panorama any time

## 2017-08-20 NOTE — Progress Notes (Signed)
Subjective:  Sonya Cruz is a 26 y.o. G2P0010 at 1038w2d being seen today for her first OB visit. EDD by certain LMP. No chronic medical problems or medications.  She is currently monitored for the following issues for this low-risk pregnancy and has Supervision of other normal pregnancy, antepartum on their problem list.  Patient reports no complaints.  Contractions: Not present. Vag. Bleeding: None.  Movement: Absent. Denies leaking of fluid.   The following portions of the patient's history were reviewed and updated as appropriate: allergies, current medications, past family history, past medical history, past social history, past surgical history and problem list. Problem list updated.  Objective:   Vitals:   08/20/17 1356 08/20/17 1359  BP: 115/75   Pulse: 76   Weight: 55.3 kg (122 lb)   Height:  5\' 3"  (1.6 m)    Fetal Status: Fetal Heart Rate (bpm): 176   Movement: Absent     General:  Alert, oriented and cooperative. Patient is in no acute distress.  Skin: Skin is warm and dry. No rash noted.   Cardiovascular: Normal heart rate noted  Respiratory: Normal respiratory effort, no problems with respiration noted  Abdomen: Soft, gravid, appropriate for gestational age. Pain/Pressure: Absent     Pelvic:  Cervical exam deferred        Extremities: Normal range of motion.  Edema: None  Mental Status: Normal mood and affect. Normal behavior. Normal judgment and thought content.   Urinalysis: Urine Protein: Negative Urine Glucose: Negative  Assessment and Plan:  Pregnancy: G2P0010 at 1838w2d  1. Supervision of other normal pregnancy, antepartum Prenatal care and labs reviewed with pt and FOB Information on genetic testing reviewed with pt. Uncertain at this time, will check insurance coverage Pt had normal pap last year, to obtain a copy and bring to next OB visit Baby scripts started  - CHG URINE PREGNANCY TEST - Obstetric Panel, Including HIV - Culture, OB Urine -  GC/Chlamydia probe amp ([place] - Cystic fibrosis gene test - Babyscripts Schedule Optimization - US MFM OB COMP + 14 WK; Future - Cervicovaginal ancillary only - POCT urinalysis dip (device)  Preterm labor symptoms and general obstetric precautions including but not limited to vaginal bleeding, contractions, leaking of fluid and fetal movement were reviewed in detail with the patient. Please refer to After Visit Summary for other counseling recommendations.  Return in about 2 weeks (around 09/03/2017) for OB visit.   Hermina StaggersErvin, Kristyana Notte L, MD

## 2017-08-20 NOTE — Progress Notes (Signed)
Here for first visit. UA showed + nitrites. C/o leaking thin fluid like water for last 3 weeks. Declines flu shot. Had pap in 2017 and it was normal [place] [facility]

## 2017-08-21 ENCOUNTER — Encounter (HOSPITAL_COMMUNITY): Payer: Self-pay

## 2017-08-21 ENCOUNTER — Encounter (HOSPITAL_COMMUNITY): Payer: Self-pay | Admitting: Emergency Medicine

## 2017-08-21 LAB — CERVICOVAGINAL ANCILLARY ONLY
Bacterial vaginitis: NEGATIVE
CANDIDA VAGINITIS: NEGATIVE
CHLAMYDIA, DNA PROBE: NEGATIVE
Neisseria Gonorrhea: NEGATIVE
Trichomonas: NEGATIVE

## 2017-08-21 LAB — OBSTETRIC PANEL, INCLUDING HIV
ANTIBODY SCREEN: NEGATIVE
BASOS: 0 %
Basophils Absolute: 0 10*3/uL (ref 0.0–0.2)
EOS (ABSOLUTE): 0.1 10*3/uL (ref 0.0–0.4)
Eos: 1 %
HEMATOCRIT: 36.2 % (ref 34.0–46.6)
HIV SCREEN 4TH GENERATION: NONREACTIVE
Hemoglobin: 12.2 g/dL (ref 11.1–15.9)
Hepatitis B Surface Ag: NEGATIVE
IMMATURE GRANS (ABS): 0 10*3/uL (ref 0.0–0.1)
Immature Granulocytes: 0 %
LYMPHS: 14 %
Lymphocytes Absolute: 1.2 10*3/uL (ref 0.7–3.1)
MCH: 30.2 pg (ref 26.6–33.0)
MCHC: 33.7 g/dL (ref 31.5–35.7)
MCV: 90 fL (ref 79–97)
MONOS ABS: 0.4 10*3/uL (ref 0.1–0.9)
Monocytes: 4 %
Neutrophils Absolute: 7 10*3/uL (ref 1.4–7.0)
Neutrophils: 81 %
Platelets: 258 10*3/uL (ref 150–379)
RBC: 4.04 x10E6/uL (ref 3.77–5.28)
RDW: 13.1 % (ref 12.3–15.4)
RPR Ser Ql: NONREACTIVE
Rh Factor: POSITIVE
Rubella Antibodies, IGG: 3.23 index (ref >0.99)
WBC: 8.7 10*3/uL (ref 3.4–10.8)

## 2017-08-24 LAB — URINE CULTURE, OB REFLEX

## 2017-08-24 LAB — CULTURE, OB URINE

## 2017-08-27 DIAGNOSIS — O99891 Other specified diseases and conditions complicating pregnancy: Secondary | ICD-10-CM | POA: Insufficient documentation

## 2017-08-27 DIAGNOSIS — O9989 Other specified diseases and conditions complicating pregnancy, childbirth and the puerperium: Secondary | ICD-10-CM

## 2017-08-27 DIAGNOSIS — R8271 Bacteriuria: Secondary | ICD-10-CM | POA: Insufficient documentation

## 2017-08-28 ENCOUNTER — Telehealth: Payer: Self-pay | Admitting: General Practice

## 2017-08-28 DIAGNOSIS — N3000 Acute cystitis without hematuria: Secondary | ICD-10-CM

## 2017-08-28 MED ORDER — AMPICILLIN 500 MG PO CAPS: 500.0000 mg | ORAL_CAPSULE | Freq: Three times a day (TID) | ORAL | 0 refills | Status: AC

## 2017-08-28 NOTE — Telephone Encounter (Signed)
Med ordered. Called and informed patient of UTI & antibiotic sent to pharmacy. Patient verbalized understanding & had no questions

## 2017-08-28 NOTE — Telephone Encounter (Signed)
-----   Message from Hermina StaggersMichael L Ervin, MD sent at 08/27/2017 12:52 PM EST ----- Ampicillin 500 mg po tid x 7 days for UTI Thanks Casimiro NeedleMichael

## 2017-08-29 LAB — CYSTIC FIBROSIS GENE TEST

## 2017-09-05 ENCOUNTER — Encounter: Payer: Self-pay | Admitting: Obstetrics and Gynecology

## 2017-09-11 ENCOUNTER — Ambulatory Visit (INDEPENDENT_AMBULATORY_CARE_PROVIDER_SITE_OTHER): Payer: Self-pay

## 2017-09-11 VITALS — BP 113/64 | HR 67 | Wt 122.7 lb

## 2017-09-11 DIAGNOSIS — Z3481 Encounter for supervision of other normal pregnancy, first trimester: Secondary | ICD-10-CM

## 2017-09-11 DIAGNOSIS — Z3480 Encounter for supervision of other normal pregnancy, unspecified trimester: Secondary | ICD-10-CM

## 2017-09-11 NOTE — Patient Instructions (Signed)
Contraception Choices Contraception, also called birth control, means things to use or ways to try not to get pregnant. Hormonal birth control This kind of birth control uses hormones. Here are some types of hormonal birth control:  A tube that is put under skin of the arm (implant). The tube can stay in for as long as 3 years.  Shots to get every 3 months (injections).  Pills to take every day (birth control pills).  A patch to change 1 time each week for 3 weeks (birth control patch). After that, the patch is taken off for 1 week.  A ring to put in the vagina. The ring is left in for 3 weeks. Then it is taken out of the vagina for 1 week. Then a new ring is put in.  Pills to take after unprotected sex (emergency birth control pills).  Barrier birth control Here are some types of barrier birth control:  A thin covering that is put on the penis before sex (female condom). The covering is thrown away after sex.  A soft, loose covering that is put in the vagina before sex (female condom). The covering is thrown away after sex.  A rubber bowl that sits over the cervix (diaphragm). The bowl must be made for you. The bowl is put into the vagina before sex. The bowl is left in for 6-8 hours after sex. It is taken out within 24 hours.  A small, soft cup that fits over the cervix (cervical cap). The cup must be made for you. The cup can be left in for 6-8 hours after sex. It is taken out within 48 hours.  A sponge that is put into the vagina before sex. It must be left in for at least 6 hours after sex. It must be taken out within 30 hours. Then it is thrown away.  A chemical that kills or stops sperm from getting into the uterus (spermicide). It may be a pill, cream, jelly, or foam to put in the vagina. The chemical should be used at least 10-15 minutes before sex.  IUD (intrauterine) birth control An IUD is a small, T-shaped piece of plastic. It is put inside the uterus. There are two  kinds:  Hormone IUD. This kind can stay in for 3-5 years.  Copper IUD. This kind can stay in for 10 years.  Permanent birth control Here are some types of permanent birth control:  Surgery to block the fallopian tubes.  Having an insert put into each fallopian tube.  Surgery to tie off the tubes that carry sperm (vasectomy).  Natural planning birth control Here are some types of natural planning birth control:  Not having sex on the days the woman could get pregnant.  Using a calendar: ? To keep track of the length of each period. ? To find out what days pregnancy can happen. ? To plan to not have sex on days when pregnancy can happen.  Watching for symptoms of ovulation and not having sex during ovulation. One way the woman can check for ovulation is to check her temperature.  Waiting to have sex until after ovulation.  Summary  Contraception, also called birth control, means things to use or ways to try not to get pregnant.  Hormonal methods of birth control include implants, injections, pills, patches, vaginal rings, and emergency birth control pills.  Barrier methods of birth control can include female condoms, female condoms, diaphragms, cervical caps, sponges, and spermicides.  There are two types of   IUD (intrauterine device) birth control. An IUD can be put in a woman's uterus to prevent pregnancy for 3-5 years.  Permanent sterilization can be done through a procedure for males, females, or both.  Natural planning methods involve not having sex on the days when the woman could get pregnant. This information is not intended to replace advice given to you by your health care provider. Make sure you discuss any questions you have with your health care provider. Document Released: 07/30/2009 Document Revised: 10/12/2016 Document Reviewed: 10/12/2016 Elsevier Interactive Patient Education  2017 Elsevier [employer]  AREA PEDIATRIC/FAMILY PRACTICE PHYSICIANS  [facility] [address] [place], [place]  [address] Phone - [phone]   Fax - [phone]  [facility] [address] [place], [place] [address] Phone - [phone]   Fax - [phone]  JACK AMOS [address] [place], [place]  [address] Phone - [phone]   Fax - [phone]  [facility] [address] [place], [place]  [address] Phone - [phone]   Fax - [phone]  [facility] [address] [place], [place]  [address] Phone - [phone]   Fax - [phone]  [facility] [address], [address] [place], [place]  [address] Phone - [phone]   Fax - [phone]  [facility] [address], [address] [place], [place]  [address] Phone - [phone]   Fax - [phone]  [facility] [address] [place], [place]  [address] Phone - [phone]   Fax - [phone]  [facility] [address] [place], [place]  [address] Phone - [phone]   Fax - [phone] [facility] [address] [place], [place]  [address] Phone - [phone]   Fax - [phone]  [facility] [address] [place], [place]  [address] Phone - [phone]   Fax - [phone]  [facility] [address], [address] [place], [place]  [address] Phone - [phone]   Fax - [phone]  [facility] [address] [place], [place]  [address] Phone - [phone]   Fax - [phone]  [facility] [address], [address] [place] [place], [place]  [address] Phone - [phone]  [facility] [address] [place], [place]  [address] Phone - [phone]   Fax - [phone]  [facility] [address], [address] [place], [place]  [address] Phone - [phone]    Fax - [phone]  [facility] [address] [place], [place]  [address] Phone - [phone]   Fax - [phone]  [facility] [address] [place], [place]  [address] Phone - [phone]   Fax - [phone]   [facility] [address], [address] [place], [place]  [address] Phone - [phone]   Fax - [phone]  [facility] [address], [address] [place], [place]  [address] Phone - [phone]   Fax - [phone]  DAVID RUBIN [address], [address] [place], [place]  [address] Phone - [phone]   Fax - [phone]  [facility] [address], [address] [place], [place]  [address] Phone - [phone]   Fax - [phone]  [facility] [address] [place] , [place]  [address] Phone - [phone]   Fax - [phone] [facility]  [place] [address] [place], [place]  [address] Phone - [phone]   Fax - [phone]  [facility] [address] [place], [place]  [address] Phone - [phone]   Fax - [phone]  [facility]  [address], [address] [place], [place]  [address] Phone - [phone]   Fax - [phone]  [facility] - Leroy Wyvonne Lenzharlene Flemming MD [address] [place] [address] Phone [phone]  Fax [phone]  CIRCUMCISION  Circumcision is considered an elective/non-medically necessary procedure. There are many reasons parents decide to have their sons circumsized. During the first year of life circumcised males have a reduced risk of urinary tract infections but after this year the rates between circumcised males and uncircumcised males are the same.  It is safe to have your son circumcised outside of the hospital and the places above perform them regularly.    Places to have your son circumcised:     [facility] [phone] $480 by 4 wks  [facility] [phone] $244 by 4 wks  [facility] [phone] $175 by 2 wks  [facility] [phone] $250 by 7 days [facility] [phone] $150 by 4 wks  These prices sometimes change but are roughly what you can expect to pay. Please call and confirm pricing.

## 2017-09-11 NOTE — Progress Notes (Signed)
   PRENATAL VISIT NOTE  Subjective:  Normand SloopShelby Miller Betts is a 26 y.o. G2P0010 at 4273w3d being seen today for ongoing prenatal care.  She is currently monitored for the following issues for this low-risk pregnancy and has Supervision of other normal pregnancy, antepartum and Asymptomatic bacteriuria during pregnancy on their problem list.  Patient reports no complaints. Patient completed course of antibiotics prescribed at last visit. No current urinary symptoms Contractions: Not present. Vag. Bleeding: None.  Movement: Absent. Denies leaking of fluid.   The following portions of the patient's history were reviewed and updated as appropriate: allergies, current medications, past family history, past medical history, past social history, past surgical history and problem list. Problem list updated.  Objective:   Vitals:   09/11/17 0753  BP: 113/64  Pulse: 67  Weight: 122 lb 11.2 oz (55.7 kg)    Fetal Status: Fetal Heart Rate (bpm): 156   Movement: Absent     General:  Alert, oriented and cooperative. Patient is in no acute distress.  Skin: Skin is warm and dry. No rash noted.   Cardiovascular: Normal heart rate noted  Respiratory: Normal respiratory effort, no problems with respiration noted  Abdomen: Soft, gravid, appropriate for gestational age.  Pain/Pressure: Absent     Pelvic: Cervical exam deferred        Extremities: Normal range of motion.  Edema: None  Mental Status:  Normal mood and affect. Normal behavior. Normal judgment and thought content.   Assessment and Plan:  Pregnancy: G2P0010 at 1873w3d  1. Supervision of other normal pregnancy, antepartum -Results from last visit reveiwed -Patient does not desire any further genetic screening -Routine care, reviewed schedule of next visits  Preterm labor symptoms and general obstetric precautions including but not limited to vaginal bleeding, contractions, leaking of fluid and fetal movement were reviewed in detail with the  patient. Please refer to After Visit Summary for other counseling recommendations.  Return in about 4 weeks (around 10/09/2017), or ROB.   Rolm BookbinderCaroline M Jule Whitsel, CNM  09/11/17 8:16 AM

## 2017-10-16 NOTE — L&D Delivery Note (Addendum)
Delivery Note Pt became complete spontaneously at 0929; she labored down prior to pushing, at which point ctx were spacing out and Pitocin aug was started. At 12:03 PM a viable female was delivered via Vaginal, Spontaneous (Presentation: LOA ).  APGAR: 8, 9; weight: pending. Medium nuchal cord x 1 reduced prior to delivery.   Placenta status: spont, intact .  Cord: 3 vessel  Anesthesia: Epidural  Episiotomy: None Lacerations: R labial abrasion, hemostatic, not repaired Est. Blood Loss (mL):  100  Mom to postpartum.  Baby to Couplet care / Skin to Skin.  Cam HaiSHAW, KIMBERLY CNM 03/22/2018, 12:29 PM   Please schedule this patient for Postpartum visit in: 4 weeks with the following provider: Any provider For C/S patients schedule nurse incision check in weeks 2 weeks: no Low risk pregnancy complicated by: none Delivery mode:  SVD Anticipated Birth Control:  POPs PP Procedures needed: none  Schedule Integrated BH visit: no

## 2017-10-23 ENCOUNTER — Ambulatory Visit (HOSPITAL_COMMUNITY)
Admission: RE | Admit: 2017-10-23 | Discharge: 2017-10-23 | Disposition: A | Discharge status: Home / Self Care | Payer: Non-veteran care | Source: Ambulatory Visit | Attending: Obstetrics and Gynecology | Admitting: Obstetrics and Gynecology

## 2017-10-23 ENCOUNTER — Other Ambulatory Visit: Payer: Self-pay | Admitting: Obstetrics and Gynecology

## 2017-10-23 DIAGNOSIS — Z3480 Encounter for supervision of other normal pregnancy, unspecified trimester: Secondary | ICD-10-CM

## 2017-10-23 DIAGNOSIS — Z3689 Encounter for other specified antenatal screening: Secondary | ICD-10-CM

## 2017-10-23 DIAGNOSIS — Z3A19 19 weeks gestation of pregnancy: Secondary | ICD-10-CM

## 2017-11-02 ENCOUNTER — Telehealth: Payer: Self-pay | Admitting: *Deleted

## 2017-11-02 DIAGNOSIS — Z3480 Encounter for supervision of other normal pregnancy, unspecified trimester: Secondary | ICD-10-CM

## 2017-11-02 NOTE — Telephone Encounter (Signed)
US ordered, scheduled and notified patient by MyChart message.

## 2017-12-04 ENCOUNTER — Ambulatory Visit (HOSPITAL_COMMUNITY)
Admission: RE | Admit: 2017-12-04 | Discharge: 2017-12-04 | Disposition: A | Discharge status: Home / Self Care | Payer: Non-veteran care | Source: Ambulatory Visit | Attending: Obstetrics and Gynecology | Admitting: Obstetrics and Gynecology

## 2017-12-04 DIAGNOSIS — Z362 Encounter for other antenatal screening follow-up: Secondary | ICD-10-CM | POA: Insufficient documentation

## 2017-12-04 DIAGNOSIS — Z3482 Encounter for supervision of other normal pregnancy, second trimester: Secondary | ICD-10-CM | POA: Diagnosis not present

## 2017-12-04 DIAGNOSIS — Z3A25 25 weeks gestation of pregnancy: Secondary | ICD-10-CM | POA: Diagnosis not present

## 2017-12-04 DIAGNOSIS — Z3480 Encounter for supervision of other normal pregnancy, unspecified trimester: Secondary | ICD-10-CM

## 2017-12-21 ENCOUNTER — Telehealth: Payer: Self-pay | Admitting: General Practice

## 2017-12-21 NOTE — Telephone Encounter (Signed)
Called and notified patient of OB appointment.  Patient verbalized understanding.

## 2018-01-03 ENCOUNTER — Ambulatory Visit (INDEPENDENT_AMBULATORY_CARE_PROVIDER_SITE_OTHER): Payer: BLUE CROSS/BLUE SHIELD | Admitting: Medical

## 2018-01-03 ENCOUNTER — Encounter: Payer: Self-pay | Admitting: Medical

## 2018-01-03 ENCOUNTER — Other Ambulatory Visit: Payer: No Typology Code available for payment source

## 2018-01-03 DIAGNOSIS — O0993 Supervision of high risk pregnancy, unspecified, third trimester: Secondary | ICD-10-CM

## 2018-01-03 DIAGNOSIS — Z3480 Encounter for supervision of other normal pregnancy, unspecified trimester: Secondary | ICD-10-CM

## 2018-01-03 DIAGNOSIS — Z23 Encounter for immunization: Secondary | ICD-10-CM | POA: Diagnosis not present

## 2018-01-03 DIAGNOSIS — Z3483 Encounter for supervision of other normal pregnancy, third trimester: Secondary | ICD-10-CM

## 2018-01-03 NOTE — Progress Notes (Signed)
   PRENATAL VISIT NOTE  Subjective:  Sonya Cruz is a 27 y.o. G2P0010 at 5384w5d being seen today for ongoing prenatal care.  She is currently monitored for the following issues for this high-risk pregnancy and has Supervision of other normal pregnancy, antepartum and Asymptomatic bacteriuria during pregnancy on their problem list.  Patient reports no complaints.  Contractions: Not present. Vag. Bleeding: None.  Movement: Present. Denies leaking of fluid.   The following portions of the patient's history were reviewed and updated as appropriate: allergies, current medications, past family history, past medical history, past social history, past surgical history and problem list. Problem list updated.  Objective:   Vitals:   01/03/18 1115  BP: 102/62  Pulse: 82  Weight: 135 lb 9.6 oz (61.5 kg)    Fetal Status: Fetal Heart Rate (bpm): 142 Fundal Height: 29 cm Movement: Present     General:  Alert, oriented and cooperative. Patient is in no acute distress.  Skin: Skin is warm and dry. No rash noted.   Cardiovascular: Normal heart rate noted  Respiratory: Normal respiratory effort, no problems with respiration noted  Abdomen: Soft, gravid, appropriate for gestational age.  Pain/Pressure: Absent     Pelvic: Cervical exam deferred        Extremities: Normal range of motion.  Edema: None  Mental Status:  Normal mood and affect. Normal behavior. Normal judgment and thought content.   Assessment and Plan:  Pregnancy: G2P0010 at 7384w5d  1. Supervision of other normal pregnancy, antepartum - Tdap vaccine greater than or equal to 7yo IM - 2 hour GTT, CBC, HIV, RPR today   Preterm labor symptoms and general obstetric precautions including but not limited to vaginal bleeding, contractions, leaking of fluid and fetal movement were reviewed in detail with the patient. Please refer to After Visit Summary for other counseling recommendations.  Return in about 1 month (around 01/31/2018) for  LOB, Babyscripts.   Vonzella NippleJulie Jezel Basto, PA-C

## 2018-01-03 NOTE — Patient Instructions (Signed)

## 2018-01-03 NOTE — Progress Notes (Signed)
Tdap given 01/03/18 @ 11:25 in right arm

## 2018-01-04 LAB — CBC
Hematocrit: 34.2 % (ref 34.0–46.6)
Hemoglobin: 11.3 g/dL (ref 11.1–15.9)
MCH: 30.6 pg (ref 26.6–33.0)
MCHC: 33 g/dL (ref 31.5–35.7)
MCV: 93 fL (ref 79–97)
Platelets: 208 x10E3/uL (ref 150–379)
RBC: 3.69 x10E6/uL — ABNORMAL LOW (ref 3.77–5.28)
RDW: 13.5 % (ref 12.3–15.4)
WBC: 8.4 x10E3/uL (ref 3.4–10.8)

## 2018-01-04 LAB — SYPHILIS: RPR W/REFLEX TO RPR TITER AND TREPONEMAL ANTIBODIES, TRADITIONAL SCREENING AND DIAGNOSIS ALGORITHM: RPR Ser Ql: NONREACTIVE

## 2018-01-04 LAB — HIV ANTIBODY (ROUTINE TESTING W REFLEX): HIV Screen 4th Generation wRfx: NONREACTIVE

## 2018-01-04 LAB — GLUCOSE TOLERANCE, 2 HOURS W/ 1HR
GLUCOSE, 2 HOUR: 110 mg/dL (ref 65–152)
GLUCOSE, FASTING: 67 mg/dL (ref 65–91)
Glucose, 1 hour: 118 mg/dL (ref 65–179)

## 2018-01-09 ENCOUNTER — Telehealth: Payer: No Typology Code available for payment source | Admitting: Family

## 2018-01-09 DIAGNOSIS — O26899 Other specified pregnancy related conditions, unspecified trimester: Secondary | ICD-10-CM

## 2018-01-09 DIAGNOSIS — N898 Other specified noninflammatory disorders of vagina: Secondary | ICD-10-CM

## 2018-01-09 DIAGNOSIS — R399 Unspecified symptoms and signs involving the genitourinary system: Secondary | ICD-10-CM

## 2018-01-09 NOTE — Progress Notes (Signed)
Based on what you shared with me it looks like you have a serious condition that should be evaluated in a face to face office visit.  NOTE: If you entered your credit card information for this eVisit, you will not be charged. You may see a "hold" on your card for the $30 but that hold will drop off and you will not have a charge processed.  If you are having a true medical emergency please call 911.  If you need an urgent face to face visit, [facility] has four urgent care centers for your convenience.  If you need care fast and have a high deductible or no insurance consider:   [email or web address] to reserve your spot online an avoid wait times  [facility] [address], [address] [place], [place] [address] 8 am to 8 pm Monday-Friday 10 am to 4 pm Saturday-Sunday *Across the street from [employer]  [facility]  [address] [place], [address] 8 am to 5 pm Monday-Friday * In the [facility] the [facility]   The following sites will take your  insurance:  . [facility]  [phone] Get Driving Directions Find a Provider at this Location  [address] [place], [place] [address] . 10 am to 8 pm Monday-Friday . 12 pm to 8 pm Saturday-Sunday   . [facility]  [phone] Get Driving Directions Find a Provider at this Location  [address], [address] [place], [place] [address] . 8 am to 8 pm Monday-Friday . 9 am to 6 pm Saturday . 11 am to 6 pm Sunday   . [facility] [place]  [phone] Get Driving Directions  [address] [place], [place] [address] . 8 am to 8 pm Monday-Friday . 8 am to 4 pm Saturday-Sunday   Your e-visit answers were reviewed by a board certified advanced clinical practitioner to complete your personal care plan.  Thank you for using e-Visits.  

## 2018-01-14 ENCOUNTER — Ambulatory Visit: Payer: No Typology Code available for payment source

## 2018-01-31 ENCOUNTER — Ambulatory Visit (INDEPENDENT_AMBULATORY_CARE_PROVIDER_SITE_OTHER): Payer: No Typology Code available for payment source | Admitting: Obstetrics & Gynecology

## 2018-01-31 VITALS — BP 110/72 | HR 69 | Wt 141.0 lb

## 2018-01-31 DIAGNOSIS — O9989 Other specified diseases and conditions complicating pregnancy, childbirth and the puerperium: Secondary | ICD-10-CM

## 2018-01-31 DIAGNOSIS — R8271 Bacteriuria: Secondary | ICD-10-CM

## 2018-01-31 DIAGNOSIS — Z3483 Encounter for supervision of other normal pregnancy, third trimester: Secondary | ICD-10-CM

## 2018-01-31 DIAGNOSIS — Z3480 Encounter for supervision of other normal pregnancy, unspecified trimester: Secondary | ICD-10-CM

## 2018-01-31 DIAGNOSIS — Z029 Encounter for administrative examinations, unspecified: Secondary | ICD-10-CM

## 2018-01-31 NOTE — Progress Notes (Signed)
   PRENATAL VISIT NOTE  Subjective:  Normand SloopShelby Miller Betts is a 27 y.o. G2P0010 at 6918w5d being seen today for ongoing prenatal care.  She is currently monitored for the following issues for this low-risk pregnancy and has Supervision of other normal pregnancy, antepartum and Asymptomatic bacteriuria during pregnancy on their problem list.  Patient reports no complaints.  Contractions: Not present. Vag. Bleeding: None.  Movement: Present. Denies leaking of fluid.   The following portions of the patient's history were reviewed and updated as appropriate: allergies, current medications, past family history, past medical history, past social history, past surgical history and problem list. Problem list updated.  Objective:   Vitals:   01/31/18 1441  BP: 110/72  Pulse: 69  Weight: 141 lb (64 kg)    Fetal Status:     Movement: Present     General:  Alert, oriented and cooperative. Patient is in no acute distress.  Skin: Skin is warm and dry. No rash noted.   Cardiovascular: Normal heart rate noted  Respiratory: Normal respiratory effort, no problems with respiration noted  Abdomen: Soft, gravid, appropriate for gestational age.  Pain/Pressure: Absent     Pelvic: Cervical exam deferred        Extremities: Normal range of motion.  Edema: None  Mental Status: Normal mood and affect. Normal behavior. Normal judgment and thought content.   Assessment and Plan:  Pregnancy: G2P0010 at 2818w5d  1. Supervision of other normal pregnancy, antepartum  2. Asymptomatic bacteriuria during pregnancy  Preterm labor symptoms and general obstetric precautions including but not limited to vaginal bleeding, contractions, leaking of fluid and fetal movement were reviewed in detail with the patient. Please refer to After Visit Summary for other counseling recommendations.  Return in about 1 month (around 02/28/2018).   Willodean Rosenthalarolyn Harraway-Smith, MD

## 2018-01-31 NOTE — Patient Instructions (Addendum)
Natural Childbirth Natural childbirth is going through labor and delivery without any drugs to relieve pain. Additionally, fetal monitors are not used, a delivery is not done, and a surgical cut to enlarge the vaginal opening (episiotomy) is not made. With the help of a birthing professional (midwife or health care provider), you direct your own labor and delivery. Many women choose natural childbirth because it makes them feel more in control and in touch with their labor and delivery. Some woman also choose natural childbirth because they are concerned about medicines affecting them and their babies. Pregnant women with a high-risk pregnancy should not attempt natural childbirth. It is better to deliver the infant in a hospital if an emergency situation arises. Sometimes, a health care provider has to get involved for the health and safety of the mother and infant. Techniques for natural childbirth  The Lamaze method-This method teaches parents that having a baby is normal, healthy, and natural. It also teaches the mother to take a neutral position regarding pain medicine and numbing medicines and to make an informed decision about using these medicines when the time comes.  The Bradley method (also called husband-coached birth)-This method teaches the father or partner to be the birth coach. It encourages the mother to exercise and eat a balanced, nutritious diet. It also involves relaxation and deep breathing exercises and preparing the parents for emergency situations. Methods of dealing with labor pain and delivery  Meditation.  Yoga.  Hypnosis.  Acupuncture.  Massage.  Changing positions (walking, rocking, showering, leaning on birth balls).  Lying in warm water or a whirlpool bath.  Finding an activity that keeps your mind off of the labor pain.  Listening to soft music.  Focusing on a particular object (visual imagery). Before going into labor  Be sure you and your spouse or  partner are in agreement about having a natural childbirth.  Decide if your health care provider or a midwife will deliver your baby.  Decide if you will have your baby in the hospital, at a birthing center, or at home.  If you have children, make plans to have someone take care of them when you go to the hospital or birthing center.  Know the distance and the time it takes to go to the hospital or birthing center. Practice going there and time it to be sure.  Have a bag packed with a nightgown, bathrobe, and toiletries. Be ready to take it with you when you go into labor.  Keep phone numbers of your family and friends handy if you need to call someone when you go into labor.  Your spouse or partner should go to all the natural childbirth technique classes.  Talk with your health care provider about the possibility of a medical emergency and what will happen if that occurs. Advantages of natural childbirth  You are in control of your labor and delivery.  You will not take medicines that could affect you and the baby.  There are no invasive procedures, such as an episiotomy.  You and your spouse or partner will work together, which can increase your bond with each other.  In most delivery centers, your family and friends can be involved in the labor and delivery process. Disadvantages of natural childbirth  You will experience pain during your labor and delivery.  The methods of helping relieve your labor pains may not work for you.  You may feel disappointed if you decide to change your mind during labor and not   have a natural childbirth. After the delivery  You will be very tired.  You will be uncomfortable because of your uterus contracting. You will feel soreness around the vagina.  You may feel cold and shaky. This is a natural reaction. This information is not intended to replace advice given to you by your health care provider. Make sure you discuss any questions you  have with your health care provider. Document Released: 09/14/2008 Document Revised: 03/09/2016 Document Reviewed: 06/09/2013 Elsevier Interactive Patient Education  2017 [employer] Third Trimester of Pregnancy The third trimester is from week 29 through week 42, months 7 through 9. This trimester is when your unborn baby (fetus) is growing very fast. At the end of the ninth month, the unborn baby is about 20 inches in length. It weighs about 6-10 pounds. Follow these instructions at home:  Avoid all smoking, herbs, and alcohol. Avoid drugs not approved by your doctor.  Do not use any tobacco products, including cigarettes, chewing tobacco, and electronic cigarettes. If you need help quitting, ask your doctor. You may get counseling or other support to help you quit.  Only take medicine as told by your doctor. Some medicines are safe and some are not during pregnancy.  Exercise only as told by your doctor. Stop exercising if you start having cramps.  Eat regular, healthy meals.  Wear a good support bra if your breasts are tender.  Do not use hot tubs, steam rooms, or saunas.  Wear your seat belt when driving.  Avoid raw meat, uncooked cheese, and liter boxes and soil used by cats.  Take your prenatal vitamins.  Take 1500-2000 milligrams of calcium daily starting at the 20th week of pregnancy until you deliver your baby.  Try taking medicine that helps you poop (stool softener) as needed, and if your doctor approves. Eat more fiber by eating fresh fruit, vegetables, and whole grains. Drink enough fluids to keep your pee (urine) clear or pale yellow.  Take warm water baths (sitz baths) to soothe pain or discomfort caused by hemorrhoids. Use hemorrhoid cream if your doctor approves.  If you have puffy, bulging veins (varicose veins), wear support hose. Raise (elevate) your feet for 15 minutes, 3-4 times a day. Limit salt in your diet.  Avoid heavy lifting, wear low heels, and sit  up straight.  Rest with your legs raised if you have leg cramps or low back pain.  Visit your dentist if you have not gone during your pregnancy. Use a soft toothbrush to brush your teeth. Be gentle when you floss.  You can have sex (intercourse) unless your doctor tells you not to.  Do not travel far distances unless you must. Only do so with your doctor's approval.  Take prenatal classes.  Practice driving to the hospital.  Pack your hospital bag.  Prepare the baby's room.  Go to your doctor visits. Get help if:  You are not sure if you are in labor or if your water has broken.  You are dizzy.  You have mild cramps or pressure in your lower belly (abdominal).  You have a nagging pain in your belly area.  You continue to feel sick to your stomach (nauseous), throw up (vomit), or have watery poop (diarrhea).  You have bad smelling fluid coming from your vagina.  You have pain with peeing (urination). Get help right away if:  You have a fever.  You are leaking fluid from your vagina.  You are spotting or bleeding from your  vagina.  You have severe belly cramping or pain.  You lose or gain weight rapidly.  You have trouble catching your breath and have chest pain.  You notice sudden or extreme puffiness (swelling) of your face, hands, ankles, feet, or legs.  You have not felt the baby move in over an hour.  You have severe headaches that do not go away with medicine.  You have vision changes. This information is not intended to replace advice given to you by your health care provider. Make sure you discuss any questions you have with your health care provider. Document Released: 12/27/2009 Document Revised: 03/09/2016 Document Reviewed: 12/03/2012 Elsevier Interactive Patient Education  2017 [employer].

## 2018-02-27 ENCOUNTER — Ambulatory Visit (INDEPENDENT_AMBULATORY_CARE_PROVIDER_SITE_OTHER): Payer: Self-pay

## 2018-02-27 VITALS — BP 117/84 | HR 75 | Wt 145.1 lb

## 2018-02-27 DIAGNOSIS — Z3480 Encounter for supervision of other normal pregnancy, unspecified trimester: Secondary | ICD-10-CM

## 2018-02-27 DIAGNOSIS — Z113 Encounter for screening for infections with a predominantly sexual mode of transmission: Secondary | ICD-10-CM

## 2018-02-27 LAB — OB RESULTS CONSOLE GBS: GBS: NEGATIVE

## 2018-02-27 LAB — OB RESULTS CONSOLE GC/CHLAMYDIA: Gonorrhea: NEGATIVE

## 2018-02-27 NOTE — Progress Notes (Signed)
   PRENATAL VISIT NOTE  Subjective:  Sonya Cruz is a 27 y.o. G2P0010 at [redacted]w[redacted]d being seen today for ongoing prenatal care.  She is currently monitored for the following issues for this low-risk pregnancy and has Supervision of other normal pregnancy, antepartum and Asymptomatic bacteriuria during pregnancy on their problem list.  Patient reports no complaints.  Contractions: Not present. Vag. Bleeding: None.  Movement: Present. Denies leaking of fluid.   The following portions of the patient's history were reviewed and updated as appropriate: allergies, current medications, past family history, past medical history, past social history, past surgical history and problem list. Problem list updated.  Objective:   Vitals:   02/27/18 0819  BP: 117/84  Pulse: 75  Weight: 145 lb 1.6 oz (65.8 kg)    Fetal Status: Fetal Heart Rate (bpm): 145 Fundal Height: 37 cm Movement: Present     General:  Alert, oriented and cooperative. Patient is in no acute distress.  Skin: Skin is warm and dry. No rash noted.   Cardiovascular: Normal heart rate noted  Respiratory: Normal respiratory effort, no problems with respiration noted  Abdomen: Soft, gravid, appropriate for gestational age.  Pain/Pressure: Absent     Pelvic: Cervical exam deferred        Extremities: Normal range of motion.  Edema: None  Mental Status: Normal mood and affect. Normal behavior. Normal judgment and thought content.   Assessment and Plan:  Pregnancy: G2P0010 at [redacted]w[redacted]d  1. Supervision of other normal pregnancy, antepartum - No complaints. Routine care - Culture, beta strep (group b only) - GC/Chlamydia probe amp ([place])not at [facility]  Term labor symptoms and general obstetric precautions including but not limited to vaginal bleeding, contractions, leaking of fluid and fetal movement were reviewed in detail with the patient. Please refer to After Visit Summary for other counseling recommendations.  Return in about  1 week (around 03/06/2018) for Return OB visit.  Rolm Bookbinder, CNM 02/27/18 8:34 AM

## 2018-02-27 NOTE — Patient Instructions (Signed)

## 2018-02-28 LAB — GC/CHLAMYDIA PROBE AMP (~~LOC~~) NOT AT ARMC
CHLAMYDIA, DNA PROBE: NEGATIVE
Neisseria Gonorrhea: NEGATIVE

## 2018-03-02 LAB — CULTURE, BETA STREP (GROUP B ONLY): Strep Gp B Culture: NEGATIVE

## 2018-03-06 ENCOUNTER — Ambulatory Visit (INDEPENDENT_AMBULATORY_CARE_PROVIDER_SITE_OTHER): Payer: Self-pay | Admitting: Advanced Practice Midwife

## 2018-03-06 VITALS — BP 113/73 | HR 67 | Wt 144.5 lb

## 2018-03-06 DIAGNOSIS — Z3480 Encounter for supervision of other normal pregnancy, unspecified trimester: Secondary | ICD-10-CM

## 2018-03-06 DIAGNOSIS — Z3483 Encounter for supervision of other normal pregnancy, third trimester: Secondary | ICD-10-CM

## 2018-03-06 NOTE — Progress Notes (Signed)
   PRENATAL VISIT NOTE  Subjective:  Sonya Cruz is a 27 y.o. G2P0010 at [redacted]w[redacted]d being seen today for ongoing prenatal care.  She is currently monitored for the following issues for this low-risk pregnancy and has Supervision of other normal pregnancy, antepartum and Asymptomatic bacteriuria during pregnancy on their problem list.  Patient reports no complaints.  Contractions: Not present. Vag. Bleeding: None.  Movement: Present. Denies leaking of fluid.   The following portions of the patient's history were reviewed and updated as appropriate: allergies, current medications, past family history, past medical history, past social history, past surgical history and problem list. Problem list updated.  Objective:   Vitals:   03/06/18 0822  BP: 113/73  Pulse: 67  Weight: 144 lb 8 oz (65.5 kg)    Fetal Status: Fetal Heart Rate (bpm): 138 Fundal Height: 38 cm Movement: Present     General:  Alert, oriented and cooperative. Patient is in no acute distress.  Skin: Skin is warm and dry. No rash noted.   Cardiovascular: Normal heart rate noted  Respiratory: Normal respiratory effort, no problems with respiration noted  Abdomen: Soft, gravid, appropriate for gestational age.  Pain/Pressure: Absent     Pelvic: Cervical exam deferred        Extremities: Normal range of motion.  Edema: None  Mental Status: Normal mood and affect. Normal behavior. Normal judgment and thought content.   Assessment and Plan:  Pregnancy: G2P0010 at [redacted]w[redacted]d  1. Supervision of other normal pregnancy, antepartum --anticipatory guidance about next weeks of pregnancy, next office visits --Pt desires natural labor, discussed labor support techniques including freedom of movement and positions, use of water (shower or tub), birth ball, breathing, etc.  Term labor symptoms and general obstetric precautions including but not limited to vaginal bleeding, contractions, leaking of fluid and fetal movement were reviewed  in detail with the patient. Please refer to After Visit Summary for other counseling recommendations.  Return in about 1 week (around 03/13/2018).  No future appointments.  Sharen Counter, CNM

## 2018-03-06 NOTE — Patient Instructions (Signed)

## 2018-03-14 ENCOUNTER — Encounter: Payer: Self-pay | Admitting: *Deleted

## 2018-03-14 ENCOUNTER — Ambulatory Visit (INDEPENDENT_AMBULATORY_CARE_PROVIDER_SITE_OTHER): Payer: Self-pay | Admitting: Student

## 2018-03-14 VITALS — BP 115/73 | HR 82 | Wt 146.7 lb

## 2018-03-14 DIAGNOSIS — Z3480 Encounter for supervision of other normal pregnancy, unspecified trimester: Secondary | ICD-10-CM

## 2018-03-14 NOTE — Progress Notes (Signed)
   PRENATAL VISIT NOTE  Subjective:  Sonya Cruz is a 27 y.o. G2P0010 at [redacted]w[redacted]d being seen today for ongoing prenatal care.  She is currently monitored for the following issues for this low-risk pregnancy and has Supervision of other normal pregnancy, antepartum and Asymptomatic bacteriuria during pregnancy on their problem list.  Patient reports no complaints.  Contractions: Irregular. Vag. Bleeding: None.  Movement: Present. Denies leaking of fluid.   The following portions of the patient's history were reviewed and updated as appropriate: allergies, current medications, past family history, past medical history, past social history, past surgical history and problem list. Problem list updated.  Objective:   Vitals:   03/14/18 1104  BP: 115/73  Pulse: 82  Weight: 146 lb 11.2 oz (66.5 kg)    Fetal Status: Fetal Heart Rate (bpm): 135 Fundal Height: 38 cm Movement: Present  Presentation: Vertex  General:  Alert, oriented and cooperative. Patient is in no acute distress.  Skin: Skin is warm and dry. No rash noted.   Cardiovascular: Normal heart rate noted  Respiratory: Normal respiratory effort, no problems with respiration noted  Abdomen: Soft, gravid, appropriate for gestational age.  Pain/Pressure: Present     Pelvic: Cervical exam performed Dilation: 1 Effacement (%): 50 Station: -3  Extremities: Normal range of motion.  Edema: None  Mental Status: Normal mood and affect. Normal behavior. Normal judgment and thought content.   Assessment and Plan:  Pregnancy: G2P0010 at [redacted]w[redacted]d  1. Supervision of other normal pregnancy, antepartum -SVE performed today. Membrane sweep performed today per patient request. Discussed expectations.  -Patient would like natural delivery and unsure about IOL. Discussed recommendation for IOL at 41 wks if not delivered. Patient agreeable to being scheduled for IOL at 41 wks.   Term labor symptoms and general obstetric precautions including but not  limited to vaginal bleeding, contractions, leaking of fluid and fetal movement were reviewed in detail with the patient. Please refer to After Visit Summary for other counseling recommendations.  Return in about 1 week (around 03/21/2018) for Routine OB, nst/bpp.  Future Appointments  Date Time Provider Department Center  03/21/2018  9:35 AM Currie Paris, NP [facility]  03/21/2018 10:15 AM [facility]  03/23/2018 12:00 AM [facility] ROOM [facility] None    Judeth Horn, NP

## 2018-03-14 NOTE — Patient Instructions (Signed)
Before Baby Comes Home  Before your baby arrives it is important to:   Have all of the supplies that you will need to care for your baby.   Know where to go if there is an emergency.   Discuss the baby's arrival with other family members.    What supplies will I need?    It is recommended that you have the following supplies:  Large Items   Crib.   Crib mattress.   Rear-facing infant car seat. If possible, have a trained professional check to make sure that it is installed correctly.    Feeding   6-8 bottles that are 4-5 oz in size.   6-8 nipples.   Bottle brush.   Sterilizer, or a large pan or kettle with a lid.   A way to boil and cool water.   If you will be breastfeeding:  ? Breast pump.  ? Nipple cream.  ? Nursing bra.  ? Breast pads.  ? Breast shields.   If you will be formula feeding:  ? Formula.  ? Measuring cups.  ? Measuring spoons.    Bathing   Mild baby soap and baby shampoo.   Petroleum jelly.   Soft cloth towel and washcloth.   Hooded towel.   Cotton balls.   Bath basin.    Other Supplies   Rectal thermometer.   Bulb syringe.   Baby wipes or washcloths for diaper changes.   Diaper bag.   Changing pad.   Clothing, including one-piece outfits and pajamas.   Baby nail clippers.   Receiving blankets.   Mattress pad and sheets for the crib.   Night-light for the baby's room.   Baby monitor.   2 or 3 pacifiers.   Either 24-36 cloth diapers and waterproof diaper covers or a box of disposable diapers. You may need to use as many as 10-12 diapers per day.    How do I prepare for an emergency?  Prepare for an emergency by:   Knowing how to get to the nearest hospital.   Listing the phone numbers of your baby's health care providers near your home phone and in your cell phone.    How do I prepare my family?   Decide how to handle visitors.   If you have other children:  ? Talk with them about the baby coming home. Ask them how they feel about it.  ? Read a book together about  being a new big brother or sister.  ? Find ways to let them help you prepare for the new baby.  ? Have someone ready to care for them while you are in the hospital.  This information is not intended to replace advice given to you by your health care provider. Make sure you discuss any questions you have with your health care provider.  Document Released: 09/14/2008 Document Revised: 03/09/2016 Document Reviewed: 09/09/2014  Elsevier Interactive Patient Education  2018 [employer]vier Inc[employer]

## 2018-03-15 ENCOUNTER — Telehealth (HOSPITAL_COMMUNITY): Payer: Self-pay | Admitting: *Deleted

## 2018-03-15 NOTE — Telephone Encounter (Signed)
Preadmission screen  

## 2018-03-21 ENCOUNTER — Ambulatory Visit (INDEPENDENT_AMBULATORY_CARE_PROVIDER_SITE_OTHER): Payer: Self-pay | Admitting: *Deleted

## 2018-03-21 ENCOUNTER — Inpatient Hospital Stay (HOSPITAL_COMMUNITY)
Admission: AD | Admit: 2018-03-21 | Discharge: 2018-03-24 | DRG: 807 | Disposition: A | Discharge status: Home / Self Care | Payer: Non-veteran care | Attending: Obstetrics and Gynecology | Admitting: Obstetrics and Gynecology

## 2018-03-21 ENCOUNTER — Ambulatory Visit (INDEPENDENT_AMBULATORY_CARE_PROVIDER_SITE_OTHER): Payer: Non-veteran care | Admitting: Nurse Practitioner

## 2018-03-21 ENCOUNTER — Telehealth: Payer: Self-pay

## 2018-03-21 ENCOUNTER — Ambulatory Visit: Payer: Self-pay

## 2018-03-21 VITALS — BP 108/73 | HR 74 | Wt 145.9 lb

## 2018-03-21 DIAGNOSIS — O480 Post-term pregnancy: Secondary | ICD-10-CM | POA: Diagnosis not present

## 2018-03-21 DIAGNOSIS — Z3480 Encounter for supervision of other normal pregnancy, unspecified trimester: Secondary | ICD-10-CM

## 2018-03-21 DIAGNOSIS — Z3A40 40 weeks gestation of pregnancy: Secondary | ICD-10-CM

## 2018-03-21 DIAGNOSIS — R8271 Bacteriuria: Secondary | ICD-10-CM

## 2018-03-21 DIAGNOSIS — O9902 Anemia complicating childbirth: Secondary | ICD-10-CM | POA: Diagnosis present

## 2018-03-21 DIAGNOSIS — O9989 Other specified diseases and conditions complicating pregnancy, childbirth and the puerperium: Secondary | ICD-10-CM

## 2018-03-21 DIAGNOSIS — D649 Anemia, unspecified: Secondary | ICD-10-CM | POA: Diagnosis present

## 2018-03-21 DIAGNOSIS — Z3483 Encounter for supervision of other normal pregnancy, third trimester: Secondary | ICD-10-CM

## 2018-03-21 DIAGNOSIS — Z3689 Encounter for other specified antenatal screening: Secondary | ICD-10-CM

## 2018-03-21 NOTE — Progress Notes (Signed)
Pt requests Cx exam today. IOL scheduled 6/8 @ midnight.

## 2018-03-21 NOTE — Progress Notes (Signed)
    Subjective:  Sonya Cruz is a 27 y.o. G2P0010 at 7048w5d being seen today for ongoing prenatal care.  She is currently monitored for the following issues for this low-risk pregnancy and has Supervision of other normal pregnancy, antepartum and Asymptomatic bacteriuria during pregnancy on their problem list.  Patient reports no complaints.  Contractions: Irregular. Vag. Bleeding: None.  Movement: Present. Denies leaking of fluid.   The following portions of the patient's history were reviewed and updated as appropriate: allergies, current medications, past family history, past medical history, past social history, past surgical history and problem list. Problem list updated.  Objective:   Vitals:   03/21/18 1118  BP: 108/73  Pulse: 74  Weight: 145 lb 14.4 oz (66.2 kg)    Fetal Status: Fetal Heart Rate (bpm): NST   Movement: Present     General:  Alert, oriented and cooperative. Patient is in no acute distress.  Skin: Skin is warm and dry. No rash noted.   Cardiovascular: Normal heart rate noted  Respiratory: Normal respiratory effort, no problems with respiration noted  Abdomen: Soft, gravid, appropriate for gestational age. Pain/Pressure: Present     Pelvic:  Cervical exam performed  1 cm very soft and very posterior - not a candidate for foley bulb prior to induction due to the low positioning and posterior presentation of the cervix        Extremities: Normal range of motion.  Edema: None  Mental Status: Normal mood and affect. Normal behavior. Normal judgment and thought content.   Urinalysis:      Assessment and Plan:  Pregnancy: G2P0010 at 5748w5d  1. Supervision of other normal pregnancy, antepartum   2. Post term pregnancy, antepartum condition or complication Client really wants to begin labor spontaneously.  Reviewed risks and benefits for induction.  Advised her to check for the [employer] online for positioning at home while having the mild contractions  that she is having today.  Given position of her cervix, would not recommend coming for foley bulb placement - cervix is extremely posterior.  Additionally client is very intent on starting labor spontaneously.  Is concerned that her induction is scheduled too early - scheduled to come in at 8567w0d.  After leaving from her visit, she called to cancel her induction on 03-23-18.  Rescheduled the induction on 03-26-18 and called patient to let her know of the rescheduled date.  3. NST (non-stress test) reactive Having some contractions but very mild  Term labor symptoms and general obstetric precautions including but not limited to vaginal bleeding, contractions, leaking of fluid and fetal movement were reviewed in detail with the patient. Please refer to After Visit Summary for other counseling recommendations.  No follow-ups on file.  Has been scheduled for IOL.  Sonya BernheimERRI Hailey Miles, RN, MSN, NP-BC Nurse Practitioner, [facility], [facility] 03/21/2018 12:03 PM

## 2018-03-21 NOTE — Telephone Encounter (Signed)
Error

## 2018-03-21 NOTE — Progress Notes (Signed)

## 2018-03-22 ENCOUNTER — Other Ambulatory Visit: Payer: Self-pay

## 2018-03-22 ENCOUNTER — Encounter (HOSPITAL_COMMUNITY): Payer: Self-pay

## 2018-03-22 ENCOUNTER — Telehealth: Payer: Self-pay

## 2018-03-22 ENCOUNTER — Inpatient Hospital Stay (HOSPITAL_COMMUNITY): Payer: Non-veteran care | Admitting: Anesthesiology

## 2018-03-22 DIAGNOSIS — O9902 Anemia complicating childbirth: Secondary | ICD-10-CM | POA: Diagnosis present

## 2018-03-22 DIAGNOSIS — Z3483 Encounter for supervision of other normal pregnancy, third trimester: Secondary | ICD-10-CM | POA: Diagnosis present

## 2018-03-22 DIAGNOSIS — D649 Anemia, unspecified: Secondary | ICD-10-CM | POA: Diagnosis present

## 2018-03-22 DIAGNOSIS — Z3A40 40 weeks gestation of pregnancy: Secondary | ICD-10-CM | POA: Diagnosis not present

## 2018-03-22 LAB — TYPE AND SCREEN
ABO/RH(D): B POS
Antibody Screen: NEGATIVE

## 2018-03-22 LAB — CBC
HCT: 32.6 % — ABNORMAL LOW (ref 36.0–46.0)
Hemoglobin: 11.4 g/dL — ABNORMAL LOW (ref 12.0–15.0)
MCH: 31.7 pg (ref 26.0–34.0)
MCHC: 35 g/dL (ref 30.0–36.0)
MCV: 90.6 fL (ref 78.0–100.0)
Platelets: 178 10*3/uL (ref 150–400)
RBC: 3.6 MIL/uL — ABNORMAL LOW (ref 3.87–5.11)
RDW: 12.9 % (ref 11.5–15.5)
WBC: 11.1 10*3/uL — ABNORMAL HIGH (ref 4.0–10.5)

## 2018-03-22 LAB — POCT FERN TEST: POCT Fern Test: POSITIVE

## 2018-03-22 LAB — ABO/RH: ABO/RH(D): B POS

## 2018-03-22 MED ORDER — OXYCODONE HCL 5 MG PO TABS: 5.0000 mg | ORAL_TABLET | ORAL | Status: DC | PRN

## 2018-03-22 MED ORDER — ONDANSETRON HCL 4 MG/2ML IJ SOLN
4.0000 mg | Freq: Four times a day (QID) | INTRAMUSCULAR | Status: DC | PRN
  Administered 2018-03-22: 4 mg via INTRAVENOUS
  Filled 2018-03-22: qty 2

## 2018-03-22 MED ORDER — SIMETHICONE 80 MG PO CHEW
80.0000 mg | CHEWABLE_TABLET | ORAL | Status: DC | PRN
  Filled 2018-03-22: qty 1

## 2018-03-22 MED ORDER — SENNOSIDES-DOCUSATE SODIUM 8.6-50 MG PO TABS
2.0000 | ORAL_TABLET | ORAL | Status: DC
  Administered 2018-03-22 – 2018-03-23 (×2): 2 via ORAL
  Filled 2018-03-22 (×2): qty 2

## 2018-03-22 MED ORDER — TETANUS-DIPHTH-ACELL PERTUSSIS 5-2.5-18.5 LF-MCG/0.5 IM SUSP: 0.5000 mL | Freq: Once | INTRAMUSCULAR | Status: DC

## 2018-03-22 MED ORDER — OXYCODONE-ACETAMINOPHEN 5-325 MG PO TABS: 1.0000 | ORAL_TABLET | ORAL | Status: DC | PRN

## 2018-03-22 MED ORDER — EPHEDRINE 5 MG/ML INJ
10.0000 mg | INTRAVENOUS | Status: DC | PRN
  Filled 2018-03-22: qty 2

## 2018-03-22 MED ORDER — COCONUT OIL OIL
1.0000 "application " | TOPICAL_OIL | Status: DC | PRN
  Administered 2018-03-24: 1 via TOPICAL
  Filled 2018-03-22: qty 120

## 2018-03-22 MED ORDER — BENZOCAINE-MENTHOL 20-0.5 % EX AERO
1.0000 "application " | INHALATION_SPRAY | CUTANEOUS | Status: DC | PRN
  Administered 2018-03-22: 1 via TOPICAL
  Filled 2018-03-22: qty 56

## 2018-03-22 MED ORDER — DIPHENHYDRAMINE HCL 25 MG PO CAPS: 25.0000 mg | ORAL_CAPSULE | Freq: Four times a day (QID) | ORAL | Status: DC | PRN

## 2018-03-22 MED ORDER — LACTATED RINGERS IV SOLN: 500.0000 mL | Freq: Once | INTRAVENOUS | Status: DC

## 2018-03-22 MED ORDER — LACTATED RINGERS IV SOLN
500.0000 mL | Freq: Once | INTRAVENOUS | Status: AC
  Administered 2018-03-22: 500 mL via INTRAVENOUS

## 2018-03-22 MED ORDER — WITCH HAZEL-GLYCERIN EX PADS: 1.0000 "application " | MEDICATED_PAD | CUTANEOUS | Status: DC | PRN

## 2018-03-22 MED ORDER — OXYTOCIN 40 UNITS IN LACTATED RINGERS INFUSION - SIMPLE MED
1.0000 m[IU]/min | INTRAVENOUS | Status: DC
  Administered 2018-03-22: 4 m[IU]/min via INTRAVENOUS
  Administered 2018-03-22: 2 m[IU]/min via INTRAVENOUS

## 2018-03-22 MED ORDER — FENTANYL CITRATE (PF) 100 MCG/2ML IJ SOLN
100.0000 ug | INTRAMUSCULAR | Status: DC | PRN
  Administered 2018-03-22: 100 ug via INTRAVENOUS
  Filled 2018-03-22: qty 2

## 2018-03-22 MED ORDER — ONDANSETRON HCL 4 MG PO TABS: 4.0000 mg | ORAL_TABLET | ORAL | Status: DC | PRN

## 2018-03-22 MED ORDER — ACETAMINOPHEN 325 MG PO TABS: 650.0000 mg | ORAL_TABLET | ORAL | Status: DC | PRN

## 2018-03-22 MED ORDER — ZOLPIDEM TARTRATE 5 MG PO TABS: 5.0000 mg | ORAL_TABLET | Freq: Every evening | ORAL | Status: DC | PRN

## 2018-03-22 MED ORDER — LIDOCAINE HCL (PF) 1 % IJ SOLN
30.0000 mL | INTRAMUSCULAR | Status: DC | PRN
  Filled 2018-03-22: qty 30

## 2018-03-22 MED ORDER — IBUPROFEN 600 MG PO TABS
600.0000 mg | ORAL_TABLET | Freq: Four times a day (QID) | ORAL | Status: DC
  Administered 2018-03-22 – 2018-03-24 (×8): 600 mg via ORAL
  Filled 2018-03-22 (×8): qty 1

## 2018-03-22 MED ORDER — OXYTOCIN BOLUS FROM INFUSION
500.0000 mL | Freq: Once | INTRAVENOUS | Status: AC
  Administered 2018-03-22: 500 mL via INTRAVENOUS

## 2018-03-22 MED ORDER — PRENATAL MULTIVITAMIN CH
1.0000 | ORAL_TABLET | Freq: Every day | ORAL | Status: DC
  Administered 2018-03-23 – 2018-03-24 (×2): 1 via ORAL
  Filled 2018-03-22 (×2): qty 1

## 2018-03-22 MED ORDER — LIDOCAINE HCL (PF) 1 % IJ SOLN
INTRAMUSCULAR | Status: DC | PRN
  Administered 2018-03-22 (×2): 4 mL via EPIDURAL

## 2018-03-22 MED ORDER — PHENYLEPHRINE 40 MCG/ML (10ML) SYRINGE FOR IV PUSH (FOR BLOOD PRESSURE SUPPORT)
PREFILLED_SYRINGE | INTRAVENOUS | Status: AC
  Filled 2018-03-22: qty 20

## 2018-03-22 MED ORDER — TERBUTALINE SULFATE 1 MG/ML IJ SOLN
0.2500 mg | Freq: Once | INTRAMUSCULAR | Status: DC | PRN
  Filled 2018-03-22: qty 1

## 2018-03-22 MED ORDER — FLEET ENEMA 7-19 GM/118ML RE ENEM: 1.0000 | ENEMA | RECTAL | Status: DC | PRN

## 2018-03-22 MED ORDER — PHENYLEPHRINE 40 MCG/ML (10ML) SYRINGE FOR IV PUSH (FOR BLOOD PRESSURE SUPPORT)
80.0000 ug | PREFILLED_SYRINGE | INTRAVENOUS | Status: DC | PRN
  Filled 2018-03-22: qty 5

## 2018-03-22 MED ORDER — FENTANYL 2.5 MCG/ML BUPIVACAINE 1/10 % EPIDURAL INFUSION (WH - ANES)
INTRAMUSCULAR | Status: AC
  Filled 2018-03-22: qty 100

## 2018-03-22 MED ORDER — ONDANSETRON HCL 4 MG/2ML IJ SOLN: 4.0000 mg | INTRAMUSCULAR | Status: DC | PRN

## 2018-03-22 MED ORDER — SOD CITRATE-CITRIC ACID 500-334 MG/5ML PO SOLN: 30.0000 mL | ORAL | Status: DC | PRN

## 2018-03-22 MED ORDER — OXYTOCIN 40 UNITS IN LACTATED RINGERS INFUSION - SIMPLE MED
2.5000 [IU]/h | INTRAVENOUS | Status: DC
  Administered 2018-03-22: 2.5 [IU]/h via INTRAVENOUS
  Filled 2018-03-22: qty 1000

## 2018-03-22 MED ORDER — OXYCODONE-ACETAMINOPHEN 5-325 MG PO TABS: 2.0000 | ORAL_TABLET | ORAL | Status: DC | PRN

## 2018-03-22 MED ORDER — FENTANYL 2.5 MCG/ML BUPIVACAINE 1/10 % EPIDURAL INFUSION (WH - ANES)
14.0000 mL/h | INTRAMUSCULAR | Status: DC | PRN
  Administered 2018-03-22: 13 mL/h via EPIDURAL

## 2018-03-22 MED ORDER — DIBUCAINE 1 % RE OINT: 1.0000 "application " | TOPICAL_OINTMENT | RECTAL | Status: DC | PRN

## 2018-03-22 MED ORDER — DIPHENHYDRAMINE HCL 50 MG/ML IJ SOLN: 12.5000 mg | INTRAMUSCULAR | Status: DC | PRN

## 2018-03-22 MED ORDER — LACTATED RINGERS IV SOLN
INTRAVENOUS | Status: DC
  Administered 2018-03-22: 01:00:00 via INTRAVENOUS

## 2018-03-22 MED ORDER — LACTATED RINGERS IV SOLN: 500.0000 mL | INTRAVENOUS | Status: DC | PRN

## 2018-03-22 NOTE — H&P (Addendum)
LABOR AND DELIVERY ADMISSION HISTORY AND PHYSICAL NOTE  Normand SloopShelby Miller Betts is a 27 y.o. female G2P0010 with IUP at 473w6d by LMP presenting for SOL   She reports positive fetal movement. She reports SROM at around 2230 on 6/6 which was clear fluid. She reports light bleeding upon presentation. She reports contractions every 3-5 minutes. She states that her pain is currently tolerable and would like to labor without pain medication. Pt desires that the labor process be as natural as possible. Pt reports nausea and chills occasionally but denies vomiting, HA, CP, SOB, abdominal pain, vision change.  Prenatal History/Complications: No issues during pregnancy; all routine prenatal work was normal. Pt has history of TAB in prior pregnancy.  Past Medical History: Past Medical History:  Diagnosis Date  . Abortion     Past Surgical History: Past Surgical History:  Procedure Laterality Date  . MULTIPLE TOOTH EXTRACTIONS      Obstetrical History: OB History    Gravida  2   Para  0   Term  0   Preterm  0   AB  1   Living        SAB  0   TAB  1   Ectopic  0   Multiple      Live Births              Social History: Social History   Socioeconomic History  . Marital status: Married    Spouse name: Not on file  . Number of children: Not on file  . Years of education: Not on file  . Highest education level: Not on file  Occupational History  . Not on file  Social Needs  . Financial resource strain: Not on file  . Food insecurity:    Worry: Not on file    Inability: Not on file  . Transportation needs:    Medical: Not on file    Non-medical: Not on file  Tobacco Use  . Smoking status: Never Smoker  . Smokeless tobacco: Never Used  Substance and Sexual Activity  . Alcohol use: No    Frequency: Never  . Drug use: No  . Sexual activity: Yes    Birth control/protection: None  Lifestyle  . Physical activity:    Days per week: Not on file    Minutes per  session: Not on file  . Stress: Not on file  Relationships  . Social connections:    Talks on phone: Not on file    Gets together: Not on file    Attends religious service: Not on file    Active member of club or organization: Not on file    Attends meetings of clubs or organizations: Not on file    Relationship status: Not on file  Other Topics Concern  . Not on file  Social History Narrative   ** Merged History Encounter **        Family History: Family History  Problem Relation Age of Onset  . Cervical cancer Sister     Allergies: No Known Allergies  Medications Prior to Admission  Medication Sig Dispense Refill Last Dose  . Ferrous Sulfate (IRON) 28 MG TABS Take by mouth.   Taking  . folic acid (FOLVITE) 0.5 MG tablet Take 0.5 mg by mouth daily.   Taking  . Prenatal Vit-Fe Fumarate-FA (PRENATAL MULTIVITAMIN) TABS tablet Take 1 tablet by mouth daily at 12 noon.   Taking     Review of Systems   All  systems reviewed and negative except as stated in HPI  Blood pressure 122/77, pulse 66, temperature 97.9 F (36.6 C), resp. rate 17, height 5\' 3"  (1.6 m), weight 149 lb (67.6 kg), last menstrual period 06/09/2017, SpO2 100 %. General appearance: alert, cooperative, appears stated age and no distress Lungs: no respiratory distress Heart: regular rate Abdomen: soft, non-tender Extremities: No calf swelling or tenderness Presentation: cephalic Fetal monitoring: Baseline: 130 bpm; variability: good (>6 bpm); accelerations: present; decelerations: absent Uterine activity: Contractions present; irregular (3-4 min) Dilation: 1 Effacement (%): 50 Station: -3 Exam by:: Zenia Resides, RN    Prenatal labs: ABO, Rh: B/Positive/-- (11/05 1459) Antibody: Negative (11/05 1459) Rubella: Immune RPR: Non Reactive (03/21 0915)  HBsAg: Negative (11/05 1459)  HIV: Non Reactive (03/21 0915)  GBS:   Negative Genetic screening:  Normal Anatomy US: Normal   Prenatal Transfer Tool   Maternal Diabetes: No Genetic Screening: Normal Maternal Ultrasounds/Referrals: Normal Fetal Ultrasounds or other Referrals:  None Maternal Substance Abuse:  No Significant Maternal Medications:  None Significant Maternal Lab Results: Lab values include: Other: E Coli asymptomatic bacteruria   Results for orders placed or performed during the hospital encounter of 03/21/18 (from the past 24 hour(s))  [facility] Time: 03/22/18 12:26 AM  Result Value Ref Range   POCT Fern Test Positive = ruptured amniotic membanes     Patient Active Problem List   Diagnosis Date Noted  . Asymptomatic bacteriuria during pregnancy 08/27/2017  . Supervision of other normal pregnancy, antepartum 08/20/2017    Assessment: Kileigh Ortmann is a 27 y.o. G2P0010 at [redacted]w[redacted]d here for SOL.  #Labor: Normal labor; expectant management. #Pain: Pain well controlled; would like to manage without medication as long as possible.  #FWB: Cat I #ID:  GBS neg #MOF: breast feeding #MOC: unsure, likely pills #Circ:  Yes; desires IP  Cyndee Brightly, Medical Student 6/7/201912:37 AM   I have examined this pt and agree with the above. Plan expectant management, ctx are increasing in frequency/intensity

## 2018-03-22 NOTE — Progress Notes (Addendum)
Sonya SloopShelby Miller Cruz is a 27 y.o. G2P0010 at 7520w6d by LMP admitted for SROM  Subjective:   Patient is progressing well, pain is well-controlled. Husband in the room. On her right side with ball placed between her legs.    Objective: BP 109/61   Pulse 68   Temp 98.1 F (36.7 C) (Oral)   Resp 20   Ht 5\' 3"  (1.6 m)   Wt 67.6 kg (149 lb)   LMP 06/09/2017 (Exact Date)   SpO2 100%   BMI 26.39 kg/m  No intake/output data recorded. No intake/output data recorded.  FHT:  FHR: 120 bpm, variability: moderate,  accelerations:  Present,  decelerations:  Present early UC:   4-5 minutes SVE:   Dilation: Lip/rim Effacement (%): 100 Station: Plus 1 Exam by:: katherine g jones RN   Labs: Lab Results  Component Value Date   WBC 11.1 (H) 03/22/2018   HGB 11.4 (L) 03/22/2018   HCT 32.6 (L) 03/22/2018   MCV 90.6 03/22/2018   PLT 178 03/22/2018     Assessment / Plan: Spontaneous labor, progressing normally  Labor: Progressing normally Fetal Wellbeing:  Category I Pain Control:  Epidural I/D:  GBS negative Anticipated MOD:  NSVD  Heide SparkJennifer Angila Wombles,  Physician Assistant Student 03/22/2018, 9:01 AM

## 2018-03-22 NOTE — Telephone Encounter (Signed)
Called pt and advised of new Induction date of 03/26/18 at 7:30am.Pt verbalized understanding.

## 2018-03-22 NOTE — Anesthesia Preprocedure Evaluation (Signed)
Anesthesia Evaluation  Patient identified by MRN, date of birth, ID band Patient awake    Reviewed: Allergy & Precautions, Patient's Chart, lab work & pertinent test results  Airway Mallampati: II  TM Distance: >3 FB Neck ROM: Full    Dental no notable dental hx. (+) Teeth Intact   Pulmonary neg pulmonary ROS,    Pulmonary exam normal breath sounds clear to auscultation       Cardiovascular negative cardio ROS Normal cardiovascular exam Rhythm:Regular Rate:Normal     Neuro/Psych negative neurological ROS  negative psych ROS   GI/Hepatic Neg liver ROS, GERD  ,  Endo/Other  negative endocrine ROS  Renal/GU negative Renal ROS  negative genitourinary   Musculoskeletal negative musculoskeletal ROS (+)   Abdominal   Peds  Hematology  (+) anemia ,   Anesthesia Other Findings   Reproductive/Obstetrics (+) Pregnancy                             Anesthesia Physical Anesthesia Plan  ASA: II  Anesthesia Plan: Epidural   Post-op Pain Management:    Induction:   PONV Risk Score and Plan:   Airway Management Planned: Natural Airway  Additional Equipment:   Intra-op Plan:   Post-operative Plan:   Informed Consent: I have reviewed the patients History and Physical, chart, labs and discussed the procedure including the risks, benefits and alternatives for the proposed anesthesia with the patient or authorized representative who has indicated his/her understanding and acceptance.       Plan Discussed with: Anesthesiologist  Anesthesia Plan Comments:         Anesthesia Quick Evaluation  

## 2018-03-22 NOTE — Anesthesia Procedure Notes (Signed)
Epidural Patient location during procedure: OB Start time: 03/22/2018 6:20 AM  Staffing Anesthesiologist: Mal AmabileFoster, Shyah Cadmus, MD Performed: anesthesiologist   Preanesthetic Checklist Completed: patient identified, site marked, surgical consent, pre-op evaluation, timeout performed, IV checked, risks and benefits discussed and monitors and equipment checked  Epidural Patient position: sitting Prep: site prepped and draped and DuraPrep Patient monitoring: continuous pulse ox and blood pressure Approach: midline Location: L3-L4 Injection technique: LOR air  Needle:  Needle type: Tuohy  Needle gauge: 17 G Needle length: 9 cm and 9 Needle insertion depth: 4 cm Catheter type: closed end flexible Catheter size: 19 Gauge Catheter at skin depth: 9 cm Test dose: negative and Other  Assessment Events: blood not aspirated, injection not painful, no injection resistance, negative IV test and no paresthesia  Additional Notes Patient identified. Risks and benefits discussed including failed block, incomplete  Pain control, post dural puncture headache, nerve damage, paralysis, blood pressure Changes, nausea, vomiting, reactions to medications-both toxic and allergic and post Partum back pain. All questions were answered. Patient expressed understanding and wished to proceed. Sterile technique was used throughout procedure. Epidural site was Dressed with sterile barrier dressing. No paresthesias, signs of intravascular injection Or signs of intrathecal spread were encountered.  Patient was more comfortable after the epidural was dosed. Please see RN's note for documentation of vital signs and FHR which are stable.

## 2018-03-22 NOTE — Anesthesia Postprocedure Evaluation (Signed)
Anesthesia Post Note  Patient: Sonya SloopShelby Miller Cruz  Procedure(s) Performed: AN AD HOC LABOR EPIDURAL     Patient location during evaluation: Mother Baby Anesthesia Type: Epidural Level of consciousness: awake and alert and oriented Pain management: satisfactory to patient Vital Signs Assessment: post-procedure vital signs reviewed and stable Respiratory status: spontaneous breathing and nonlabored ventilation Cardiovascular status: stable Postop Assessment: no headache, no backache, no signs of nausea or vomiting, adequate PO intake, patient able to bend at knees and epidural receding (patient up walking) Anesthetic complications: no    Last Vitals:  Vitals:   03/22/18 1310 03/22/18 1345  BP: 103/63 115/82  Pulse: 64 (!) 52  Resp:  18  Temp:  37 C  SpO2:      Last Pain:  Vitals:   03/22/18 1345  TempSrc: Oral  PainSc: (P) 0-No pain   Pain Goal:                 [name]

## 2018-03-22 NOTE — MAU Note (Signed)
SROM at 2230, clear fluid.  + FM.  Some scant bleeding.  VE yesterday-1 cm.  Also having CTX 3-5 minutes apart.

## 2018-03-23 ENCOUNTER — Inpatient Hospital Stay (HOSPITAL_COMMUNITY): Admission: RE | Admit: 2018-03-23 | Payer: BLUE CROSS/BLUE SHIELD | Source: Ambulatory Visit

## 2018-03-23 NOTE — Lactation Note (Signed)
This note was copied from a baby's chart. Lactation Consultation Note  Patient Name: Sonya Cruz ZOXWR'UToday's Date: 03/23/2018 Reason for consult: Initial assessment;1st time breastfeeding;Primapara;Term   P1 mother whose infant is now 5417 hours old.  Mother feels like breastfeeding is going well and that she has no questions/concerns at this time.  Encouraged 8-12 feeds/24 hours or earlier if he shows feeding cues.  Continue STS, breast massage and hand expression before and after feedings.    Mother will call for latch assistance at next feeding if needed.  Mom made aware of O/P services, breastfeeding support groups, community resources, and our phone # for post-discharge questions.                   Consult Status Consult Status: Follow-up Date: 03/24/18 Follow-up type: In-patient    [name] 03/23/2018, 5:31 AM

## 2018-03-23 NOTE — Progress Notes (Signed)
POSTPARTUM PROGRESS NOTE  Post Partum Day 1 Subjective:  Sonya Cruz is a 27 y.o. G2P1011 6323w6d s/p SVD.  No acute events overnight.  Pt denies problems with ambulating, voiding or po intake.  She denies nausea or vomiting.  Pain is well controlled.  She has had flatus. She has had bowel movement.  Lochia Minimal. Patient denies HA, CP, SOB, dizziness. Pt reports some issues with breastfeeding.  Objective: Blood pressure 115/79, pulse 75, temperature 97.8 F (36.6 C), temperature source Oral, resp. rate 18, height 5\' 3"  (1.6 m), weight 149 lb (67.6 kg), last menstrual period 06/09/2017, SpO2 99 %, unknown if currently breastfeeding.  Physical Exam:  General: alert, cooperative and no distress Lochia:normal flow Chest: CTAB Heart: RRR no m/r/g Abdomen: +BS, soft, nontender,  Uterine Fundus: firm, nontender DVT Evaluation: No calf swelling or tenderness Extremities: no evidence of edema  Recent Labs    03/22/18 0125  HGB 11.4*  HCT 32.6*    Assessment/Plan:  ASSESSMENT: Sonya Cruz is a 27 y.o. G2P1011 7523w6d s/p SVD  Plan for discharge tomorrow Breastfeeding: Lactation Consult if necessary Circumcision prior to discharge Contraception: POPs   LOS: 1 day   Sonya Cruz, Medical Student 03/23/2018, 8:41 AM

## 2018-03-23 NOTE — Lactation Note (Signed)
This note was copied from a baby's chart. Lactation Consultation Note  Patient Name: Sonya Cruz Sonya Cruz Date: 03/23/2018   [facility] Initial Visit:  Mother sleeping; will attempt to return later this a.m.               William Schake R Cassandre Oleksy 03/23/2018, 3:15 AM

## 2018-03-24 LAB — RPR: RPR Ser Ql: NONREACTIVE

## 2018-03-24 MED ORDER — IBUPROFEN 600 MG PO TABS: 600.0000 mg | ORAL_TABLET | Freq: Four times a day (QID) | ORAL | 0 refills | Status: DC

## 2018-03-24 MED ORDER — NORETHINDRONE 0.35 MG PO TABS: 1.0000 | ORAL_TABLET | Freq: Every day | ORAL | 11 refills | Status: DC

## 2018-03-24 NOTE — Lactation Note (Signed)
This note was copied from a baby's chart. Lactation Consultation Note  Patient Name: Boy Normand SloopShelby Miller Betts Today's Date: 03/24/2018   P1, Baby 45 hours old.  Mother denies problems or concerns. Mom encouraged to feed baby 8-12 times/24 hours and with feeding cues.  Reviewed engorgement care and monitoring voids/stools.      Maternal Data    Feeding    LATCH Score                   Interventions    Lactation Tools Discussed/Used     Consult Status      Hardie PulleyBerkelhammer, Aalivia Mcgraw Boschen 03/24/2018, 9:49 AM

## 2018-03-24 NOTE — Discharge Instructions (Signed)
Postpartum Care After Vaginal Delivery °The period of time right after you deliver your newborn is called the postpartum period. °What kind of medical care will I receive? °· You may continue to receive fluids and medicines through an IV tube inserted into one of your veins. °· If an incision was made near your vagina (episiotomy) or if you had some vaginal tearing during delivery, cold compresses may be placed on your episiotomy or your tear. This helps to reduce pain and swelling. °· You may be given a squirt bottle to use when you go to the bathroom. You may use this until you are comfortable wiping as usual. To use the squirt bottle, follow these steps: °? Before you urinate, fill the squirt bottle with warm water. Do not use hot water. °? After you urinate, while you are sitting on the toilet, use the squirt bottle to rinse the area around your urethra and vaginal opening. This rinses away any urine and blood. °? You may do this instead of wiping. As you start healing, you may use the squirt bottle before wiping yourself. Make sure to wipe gently. °? Fill the squirt bottle with clean water every time you use the bathroom. °· You will be given sanitary pads to wear. °How can I expect to feel? °· You may not feel the need to urinate for several hours after delivery. °· You will have some soreness and pain in your abdomen and vagina. °· If you are breastfeeding, you may have uterine contractions every time you breastfeed for up to several weeks postpartum. Uterine contractions help your uterus return to its normal size. °· It is normal to have vaginal bleeding (lochia) after delivery. The amount and appearance of lochia is often similar to a menstrual period in the first week after delivery. It will gradually decrease over the next few weeks to a dry, yellow-brown discharge. For most women, lochia stops completely by 6-8 weeks after delivery. Vaginal bleeding can vary from woman to woman. °· Within the first few  days after delivery, you may have breast engorgement. This is when your breasts feel heavy, full, and uncomfortable. Your breasts may also throb and feel hard, tightly stretched, warm, and tender. After this occurs, you may have milk leaking from your breasts. Your health care provider can help you relieve discomfort due to breast engorgement. Breast engorgement should go away within a few days. °· You may feel more sad or worried than normal due to hormonal changes after delivery. These feelings should not last more than a few days. If these feelings do not go away after several days, speak with your health care provider. °How should I care for myself? °· Tell your health care provider if you have pain or discomfort. °· Drink enough water to keep your urine clear or pale yellow. °· Wash your hands thoroughly with soap and water for at least 20 seconds after changing your sanitary pads, after using the toilet, and before holding or feeding your baby. °· If you are not breastfeeding, avoid touching your breasts a lot. Doing this can make your breasts produce more milk. °· If you become weak or lightheaded, or you feel like you might faint, ask for help before: °? Getting out of bed. °? Showering. °· Change your sanitary pads frequently. Watch for any changes in your flow, such as a sudden increase in volume, a change in color, the passing of large blood clots. If you pass a blood clot from your vagina, save it   to show to your health care provider. Do not flush blood clots down the toilet without having your health care provider look at them. °· Make sure that all your vaccinations are up to date. This can help protect you and your baby from getting certain diseases. You may need to have immunizations done before you leave the hospital. °· If desired, talk with your health care provider about methods of family planning or birth control (contraception). °How can I start bonding with my baby? °Spending as much time as  possible with your baby is very important. During this time, you and your baby can get to know each other and develop a bond. Having your baby stay with you in your room (rooming in) can give you time to get to know your baby. Rooming in can also help you become comfortable caring for your baby. Breastfeeding can also help you bond with your baby. °How can I plan for returning home with my baby? °· Make sure that you have a car seat installed in your vehicle. °? Your car seat should be checked by a certified car seat installer to make sure that it is installed safely. °? Make sure that your baby fits into the car seat safely. °· Ask your health care provider any questions you have about caring for yourself or your baby. Make sure that you are able to contact your health care provider with any questions after leaving the hospital. °This information is not intended to replace advice given to you by your health care provider. Make sure you discuss any questions you have with your health care provider. °Document Released: 07/30/2007 Document Revised: 03/06/2016 Document Reviewed: 09/06/2015 °Elsevier Interactive Patient Education © 2018 [employer] ° °

## 2018-03-24 NOTE — Discharge Summary (Signed)
OB Discharge Summary     Patient Name: Sonya SloopShelby Miller Cruz DOB: 04-Apr-1991 MRN: [id number]  Date of admission: 03/21/2018 Delivering MD: Cam HaiSHAW, KIMBERLY D   Date of discharge: 03/24/2018  Admitting diagnosis: 41 WKS , WATER BROKE, CTX Intrauterine pregnancy: [id number]     Secondary diagnosis:  Principal Problem:   Supervision of other normal pregnancy, antepartum Active Problems:   Indication for care in labor or delivery  Additional problems: None     Discharge diagnosis: Term Pregnancy Delivered                                                                                                Post partum procedures:None  Augmentation: Pitocin  Complications: None  Hospital course:  Onset of Labor With Vaginal Delivery     27 y.o. yo G2P1011 at 7972w6d was admitted in Latent Labor, [facility] on 03/21/2018. Patient had an uncomplicated labor course as follows:  Membrane Rupture Time/Date: 10:30 PM ,03/22/2018   Intrapartum Procedures: Episiotomy: None [1]                                         Lacerations:  None [1]  Patient had a delivery of a Viable infant. 03/22/2018  Information for the patient's newborn:  Rikki SpearingBecker, Harold Albert [[id number]][[id number]]  Delivery Method: Vaginal, Spontaneous(Filed from Delivery Summary)    Pateint had an uncomplicated postpartum course.  She is ambulating, tolerating a regular diet, passing flatus, and urinating well. Patient is discharged home in stable condition on 03/26/18.   Physical exam  Vitals:   03/22/18 2348 03/23/18 0524 03/23/18 1715 03/24/18 0531  BP: 121/73 115/79 124/80 120/84  Pulse: 61 75  68  Resp: 18 18 15 18   Temp: 98 F (36.7 C) 97.8 F (36.6 C) 98.5 F (36.9 C) 98.2 F (36.8 C)  TempSrc: Oral Oral Oral Oral  SpO2:      Weight:      Height:       General: alert, cooperative and no distress Lochia: appropriate Uterine Fundus: firm Incision: N/A DVT Evaluation: No evidence of DVT seen on physical exam. Labs: Lab Results  Component  Value Date   WBC 11.1 (H) 03/22/2018   HGB 11.4 (L) 03/22/2018   HCT 32.6 (L) 03/22/2018   MCV 90.6 03/22/2018   PLT 178 03/22/2018   No flowsheet data found.  Discharge instruction: per After Visit Summary and "Baby and Me Booklet".  After visit meds:  Allergies as of 03/24/2018   No Known Allergies     Medication List    TAKE these medications   ibuprofen 600 MG tablet Commonly known as:  ADVIL,MOTRIN Take 1 tablet (600 mg total) by mouth every 6 (six) hours.   norethindrone 0.35 MG tablet Commonly known as:  MICRONOR,CAMILA,ERRIN Take 1 tablet (0.35 mg total) by mouth daily. Start taking on:  04/21/2018   prenatal multivitamin Tabs tablet Take 1 tablet by mouth daily at 12 noon.       Diet: routine diet  Activity:  Advance as tolerated. Pelvic rest for 6 weeks.   Outpatient follow up:4 weeks Follow up Appt: Future Appointments  Date Time Provider Department Center  05/02/2018  9:15 AM Leftwich-Kirby, Wilmer Floor, CNM [facility]   Follow up Visit:No follow-ups on file.  Postpartum contraception: Progesterone only pills  Newborn Data: Live born female  Birth Weight: 7 lb 6.5 oz (3360 g) APGAR: 8, 9  Newborn Delivery   Birth date/time:  03/22/2018 12:03:00 Delivery type:  Vaginal, Spontaneous     Baby Feeding: Breast Disposition:home with mother   03/24/2018 Dorathy Kinsman, CNM

## 2018-03-26 ENCOUNTER — Inpatient Hospital Stay (HOSPITAL_COMMUNITY): Admission: RE | Admit: 2018-03-26 | Payer: Self-pay | Source: Ambulatory Visit

## 2018-03-27 NOTE — Progress Notes (Signed)
NST:  Baseline: 130 bpm, Variability: Good {> 6 bpm), Accelerations: Reactive and Decelerations: Absent   

## 2018-05-02 ENCOUNTER — Ambulatory Visit (INDEPENDENT_AMBULATORY_CARE_PROVIDER_SITE_OTHER): Payer: Non-veteran care | Admitting: Advanced Practice Midwife

## 2018-05-02 ENCOUNTER — Encounter: Payer: Self-pay | Admitting: Advanced Practice Midwife

## 2018-05-02 DIAGNOSIS — Z1389 Encounter for screening for other disorder: Secondary | ICD-10-CM

## 2018-05-02 DIAGNOSIS — Z3009 Encounter for other general counseling and advice on contraception: Secondary | ICD-10-CM

## 2018-05-02 NOTE — Progress Notes (Signed)
Subjective:     Sonya Cruz is a 27 y.o. female who presents for a postpartum visit. She is 6 weeks postpartum following a spontaneous vaginal delivery. I have fully reviewed the prenatal and intrapartum course. The delivery was at 40 gestational weeks. Outcome: spontaneous vaginal delivery. Anesthesia: none. Postpartum course has been 6. Baby's course has been normal. Baby is feeding by breast. Bleeding brown. Bowel function is normal. Bladder function is normal. Patient is not sexually active. Contraception method is oral progesterone-only contraceptive. Postpartum depression screening: negative.  The following portions of the patient's history were reviewed and updated as appropriate: allergies, current medications, past family history, past medical history, past social history, past surgical history and problem list.  Review of Systems A comprehensive review of systems was negative.   Objective:    BP 103/66   Pulse 82   Wt 123 lb (55.8 kg)   Breastfeeding? Yes   BMI 21.79 kg/m   VS reviewed, nursing note reviewed,  Constitutional: well developed, well nourished, no distress HEENT: normocephalic CV: normal rate Pulm/chest wall: normal effort Abdomen: soft Neuro: alert and oriented x 3 Skin: warm, dry Psych: affect normal   Assessment/Plan:   1. Postpartum care and examination --Pt doing well, good support at home --Light staining only, no pain --Baby doing well, exclusive BF, good milk supply, good weight gain for baby --Depression scoring wnl  2. Encounter for general counseling and advice on contraceptive management --Discussed LARCs as most effective forms of birth control.  Discussed benefits/risks of other methods.  Pt desires to continue POPs. Discussed switching to OCPs later when milk supply established if she desires.  Desires another baby in ~2 years.  Sonya Cruz, CNM 10:32 AM

## 2019-05-28 ENCOUNTER — Encounter: Payer: Self-pay | Admitting: *Deleted

## 2019-06-02 ENCOUNTER — Encounter: Payer: Self-pay | Admitting: *Deleted

## 2019-06-05 ENCOUNTER — Encounter: Payer: Self-pay | Admitting: *Deleted

## 2019-06-18 ENCOUNTER — Other Ambulatory Visit: Payer: Self-pay

## 2019-06-18 ENCOUNTER — Ambulatory Visit (INDEPENDENT_AMBULATORY_CARE_PROVIDER_SITE_OTHER): Payer: Non-veteran care | Admitting: *Deleted

## 2019-06-18 ENCOUNTER — Other Ambulatory Visit (HOSPITAL_COMMUNITY): Payer: Self-pay | Admitting: Obstetrics

## 2019-06-18 DIAGNOSIS — O0990 Supervision of high risk pregnancy, unspecified, unspecified trimester: Secondary | ICD-10-CM | POA: Insufficient documentation

## 2019-06-18 DIAGNOSIS — Z3490 Encounter for supervision of normal pregnancy, unspecified, unspecified trimester: Secondary | ICD-10-CM

## 2019-06-18 NOTE — Progress Notes (Signed)
I connected with  Sonya Cruz on 06/18/19 at  9:30 AM EDT by telephone and verified that I am speaking with the correct person using two identifiers.   I discussed the limitations, risks, security and privacy concerns of performing an evaluation and management service by telephone and the availability of in person appointments. I also discussed with the patient that there may be a patient responsible charge related to this service. The patient expressed understanding and agreed to proceed.  Per records unsure LMP but Sonya Cruz confirms she LMP was 02/23/19 and she is sure of that date.   Explained I am completing her New OB Intake today. Explained we will send her [employer] app- app sent to her while on phone-but she is unable to check while on phone. She states she thinks she has a blood pressure cuff- will let us know at her new ob appt.  Explained we will have her take her blood pressure weekly and enter into the app. Explained she will have some visits in office and some virtually. Reviewed appointment date/ time with her , our location and to wear mask, no visitors. Explained she will have exam, ob bloodwork, hemoglobin a1C, cbg , genetic testing if desired, pap if needed. Explained we will schedule an [place] at 19 weeks and she will get notification via [facility]. She voices understanding.  Linda,RN 06/18/2019  9:18 AM

## 2019-06-18 NOTE — Addendum Note (Signed)
Addended byGeralyn Flash L on: 06/18/2019 11:03 AM   Modules accepted: Orders

## 2019-06-18 NOTE — Progress Notes (Signed)
Patient seen and assessed by nursing staff during this encounter. I have reviewed the chart and agree with the documentation and plan.  Kerry Hough, PA-C 06/18/2019 1:19 PM

## 2019-06-19 ENCOUNTER — Telehealth: Payer: Self-pay | Admitting: Medical

## 2019-06-19 NOTE — Telephone Encounter (Signed)
Attempted to call patient about her appointment on 9/4 @ 8:35. No answer left voicemail instructing patient to wear a face mask for the entire appointment and no visitors are allowed during the visit. Patient instructed not to attend the appointment if she was any symptoms. Symptom list and office number left.

## 2019-06-20 ENCOUNTER — Encounter: Payer: Self-pay | Admitting: Medical

## 2019-06-20 ENCOUNTER — Ambulatory Visit (INDEPENDENT_AMBULATORY_CARE_PROVIDER_SITE_OTHER): Payer: Non-veteran care | Admitting: Medical

## 2019-06-20 ENCOUNTER — Other Ambulatory Visit: Payer: Self-pay

## 2019-06-20 VITALS — BP 114/79 | HR 80 | Wt 118.0 lb

## 2019-06-20 DIAGNOSIS — Z3490 Encounter for supervision of normal pregnancy, unspecified, unspecified trimester: Secondary | ICD-10-CM

## 2019-06-20 DIAGNOSIS — Z3A16 16 weeks gestation of pregnancy: Secondary | ICD-10-CM

## 2019-06-20 DIAGNOSIS — Z3492 Encounter for supervision of normal pregnancy, unspecified, second trimester: Secondary | ICD-10-CM

## 2019-06-20 NOTE — Patient Instructions (Signed)
Childbirth Education Options: [facility] Classes:  Childbirth education classes can help you get ready for a positive parenting experience. You can also meet other expectant parents and get free stuff for your baby. Each class runs for five weeks on the same night and costs $45 for the mother-to-be and her support person. Medicaid covers the cost if you are eligible. Call [phone] to register. [facility] Childbirth Education:  [phone] or [phone] or [email or web address]  Baby & Me Class: Discuss newborn & infant parenting and family adjustment issues with other new mothers in a relaxed environment. Each week brings a new speaker or baby-centered activity. We encourage new mothers to join us every Thursday at 11:00am. Babies birth until crawling. No registration or fee. Daddy Boot Camp: This course offers Dads-to-be the tools and knowledge needed to feel confident on their journey to becoming new fathers. Experienced dads, who have been trained as coaches, teach dads-to-be how to hold, comfort, diaper, swaddle and play with their infant while being able to support the new mom as well. A class for men taught by men. $25/dad Big Brother/Big Sister: Let your children share in the joy of a new brother or sister in this special class designed just for them. Class includes discussion about how families care for babies: swaddling, holding, diapering, safety as well as how they can be helpful in their new role. This class is designed for children ages 2 to 6, but any age is welcome. Please register each child individually. $5/child  Mom Talk: This mom-led group offers support and connection to mothers as they journey through the adjustments and struggles of that sometimes overwhelming first year after the birth of a child. Tuesdays at 10:00am and Thursdays at 6:00pm. Babies welcome. No registration or fee. Breastfeeding Support Group: This group is a mother-to-mother  support circle where moms have the opportunity to share their breastfeeding experiences. A Lactation Consultant is present for questions and concerns. Meets each Tuesday at 11:00am. No fee or registration. Breastfeeding Your Baby: Learn what to expect in the first days of breastfeeding your newborn.  This class will help you feel more confident with the skills needed to begin your breastfeeding experience. Many new mothers are concerned about breastfeeding after leaving the hospital. This class will also address the most common fears and challenges about breastfeeding during the first few weeks, months and beyond. (call for fee) Comfort Techniques and Tour: This 2 hour interactive class will provide you the opportunity to learn & practice hands-on techniques that can help relieve some of the discomfort of labor and encourage your baby to rotate toward the best position for birth. You and your partner will be able to try a variety of labor positions with birth balls and rebozos as well as practice breathing, relaxation, and visualization techniques. A tour of the [facility] is included with this class. $20 per registrant and support person Childbirth Class- Weekend Option: This class is a Weekend version of our Birth & Baby series. It is designed for parents who have a difficult time fitting several weeks of classes into their schedule. It covers the care of your newborn and the basics of labor and childbirth. It also includes a Maternity Care Center Tour of [facility] and lunch. The class is held two consecutive days: beginning on Friday evening from 6:30 - 8:30 p.m. and the next day, Saturday from 9 a.m. - 4 p.m. (call for fee) Waterbirth Class: Interested in a waterbirth?  This   informational class will help you discover whether waterbirth is the right fit for you. Education about waterbirth itself, supplies you would need and how to assemble your support team is what you can  expect from this class. Some obstetrical practices require this class in order to pursue a waterbirth. (Not all obstetrical practices offer waterbirth-check with your healthcare provider.) Register only the expectant mom, but you are encouraged to bring your partner to class! Required if planning waterbirth, no fee. Infant/Child CPR: Parents, grandparents, babysitters, and friends learn Cardio-Pulmonary Resuscitation skills for infants and children. You will also learn how to treat both conscious and unconscious choking in infants and children. This Family & Friends program does not offer certification. Register each participant individually to ensure that enough mannequins are available. (Call for fee) Grandparent Love: Expecting a grandbaby? This class is for you! Learn about the latest infant care and safety recommendations and ways to support your own child as he or she transitions into the parenting role. Taught by Registered Nurses who are childbirth instructors, but most importantly...they are grandmothers too! $10/person. Childbirth Class- Natural Childbirth: This series of 5 weekly classes is for expectant parents who want to learn and practice natural methods of coping with the process of labor and childbirth. Relaxation, breathing, massage, visualization, role of the partner, and helpful positioning are highlighted. Participants learn how to be confident in their body's ability to give birth. This class will empower and help parents make informed decisions about their own care. Includes discussion that will help new parents transition into the immediate postpartum period. Maternity Care Center Tour of [facility] is included. We suggest taking this class between 25-32 weeks, but it's only a recommendation. $75 per registrant and one support person or $30 Medicaid. Childbirth Class- 3 week Series: This option of 3 weekly classes helps you and your labor partner prepare for childbirth. Newborn  care, labor & birth, cesarean birth, pain management, and comfort techniques are discussed and a Maternity Care Center Tour of [facility] is included. The class meets at the same time, on the same day of the week for 3 consecutive weeks beginning with the starting date you choose. $60 for registrant and one support person.  Marvelous Multiples: Expecting twins, triplets, or more? This class covers the differences in labor, birth, parenting, and breastfeeding issues that face multiples' parents. NICU tour is included. Led by a Certified Childbirth Educator who is the mother of twins. No fee. Caring for Baby: This class is for expectant and adoptive parents who want to learn and practice the most up-to-date newborn care for their babies. Focus is on birth through the first six weeks of life. Topics include feeding, bathing, diapering, crying, umbilical cord care, circumcision care and safe sleep. Parents learn to recognize symptoms of illness and when to call the pediatrician. Register only the mom-to-be and your partner or support person can plan to come with you! $10 per registrant and support person Childbirth Class- online option: This online class offers you the freedom to complete a Birth and Baby series in the comfort of your own home. The flexibility of this option allows you to review sections at your own pace, at times convenient to you and your support people. It includes additional video information, animations, quizzes, and extended activities. Get organized with helpful eClass tools, checklists, and trackers. Once you register online for the class, you will receive an email within a few days to accept the invitation and begin the class when the time   is right for you. The content will be available to you for 60 days. $60 for 60 days of online access for you and your support people.  Local Doulas: Natural Baby Doulas [email or web address] Tel:  [phone] [email or web address] [facility] [phone] [email or web address] The [employer]  (also do waterbirth tub rental) [phone] [email or web address] [facility] [phone] [email or web address] [employer]hyth[employer]  [phone] [email or web address] [place] [employer] (PADA) [email or web address].org/index.htm [place] [facility]  [email or web address] Considering Waterbirth? Guide for patients at [facility]  Why consider waterbirth?  . Gentle birth for babies . Less pain medicine used in labor . May allow for passive descent/less pushing . May reduce perineal tears  . More mobility and instinctive maternal position changes . Increased maternal relaxation . Reduced blood pressure in labor  Is waterbirth safe? What are the risks of infection, drowning or other complications?  . Infection: o Very low risk (3.7 % for tub vs 4.8% for bed) o 7 in 8000 waterbirths with documented infection o Poorly cleaned equipment most common cause o Slightly lower group B strep transmission rate  . Drowning o Maternal:  - Very low risk   - Related to seizures or fainting o Newborn:  - Very low risk. No evidence of increased risk of respiratory problems in multiple large studies - Physiological protection from breathing under water - Avoid underwater birth if there are any fetal complications - Once baby's head is out of the water, keep it out.  . Birth complication o Some reports of cord trauma, but risk decreased by bringing baby to surface gradually o No evidence of increased risk of shoulder dystocia. Mothers can usually change positions faster in water than in a bed, possibly aiding the maneuvers to free the shoulder.   You must attend a Waterbirth class at [facility]  3rd Wednesday of every month from 7-9pm  Free  Register by calling [phone] or online at [email or web address]  Bring us the certificate from the class to your prenatal appointment  Meet with a midwife at 36 weeks to see if you can still plan a waterbirth and to sign the consent.   Purchase or rent the following supplies:   Water Birth Pool (Birth Pool in a Box or [place] for instance)  (Tubs start ~$125)  Single-use disposable tub liner designed for your brand of tub  New garden hose labeled "lead-free", "suitable for drinking water",  Electric drain pump to remove water (We recommend 792 gallon per hour or greater pump.)   Separate garden hose to remove the dirty water  Fish net  Bathing suit top (optional)  Long-handled mirror (optional)  Places to purchase or rent supplies  [email or web address] for tub purchases and supplies  Waterbirthsolutions.[email or web address] for tub purchases and supplies  [employer] ([email or web address]) $275 for tub rental/set-up & take down/kit   [place] [employer] (http://www.padanc.org/MeetUs.htm) Information regarding doulas (labor support) who provide pool rentals  Our practice has a Birth Pool in a Box tub at the hospital that you may borrow on a first-come-first-served basis. It is your responsibility to to set up, clean and break down the tub. We cannot guarantee the availability of this tub in advance. You are responsible for bringing all accessories listed above. If you do not have all necessary supplies you cannot have a waterbirth.    Things that would prevent you from having a waterbirth:  Premature, <37wks  Previous cesarean birth  Presence of thick meconium-stained fluid  Multiple gestation (Twins,   triplets, etc.)  Uncontrolled diabetes or gestational diabetes requiring medication  Hypertension requiring medication or diagnosis of pre-eclampsia  Heavy vaginal bleeding  Non-reassuring fetal  heart rate  Active infection (MRSA, etc.). Group B Strep is NOT a contraindication for  waterbirth.  If your labor has to be induced and induction method requires continuous  monitoring of the baby's heart rate  Other risks/issues identified by your obstetrical provider  Please remember that birth is unpredictable. Under certain unforeseeable circumstances your provider may advise against giving birth in the tub. These decisions will be made on a case-by-case basis and with the safety of you and your baby as our highest priority.   Safe Medications in Pregnancy   Acne:  Benzoyl Peroxide  Salicylic Acid   Backache/Headache:  Tylenol: 2 regular strength every 4 hours OR        2 Extra strength every 6 hours   Colds/Coughs/Allergies:  Benadryl (alcohol free) 25 mg every 6 hours as needed  Breath right strips  Claritin  Cepacol throat lozenges  Chloraseptic throat spray  Cold-Eeze- up to three times per day  Cough drops, alcohol free  Flonase (by prescription only)  Guaifenesin  Mucinex  Robitussin DM (plain only, alcohol free)  Saline nasal spray/drops  Sudafed (pseudoephedrine) & Actifed * use only after [redacted] weeks gestation and if you do not have high blood pressure  Tylenol  Vicks Vaporub  Zinc lozenges  Zyrtec   Constipation:  Colace  Ducolax suppositories  Fleet enema  Glycerin suppositories  Metamucil  Milk of magnesia  Miralax  Senokot  Smooth move tea   Diarrhea:  Kaopectate  Imodium A-D   *NO pepto Bismol   Hemorrhoids:  Anusol  Anusol HC  Preparation H  Tucks   Indigestion:  Tums  Maalox  Mylanta  Zantac  Pepcid   Insomnia:  Benadryl (alcohol free) 25mg every 6 hours as needed  Tylenol PM  Unisom, no Gelcaps   Leg Cramps:  Tums  MagGel   Nausea/Vomiting:  Bonine  Dramamine  Emetrol  Ginger extract  Sea bands  Meclizine  Nausea medication to take during pregnancy:  Unisom (doxylamine succinate 25 mg tablets) Take one  tablet daily at bedtime. If symptoms are not adequately controlled, the dose can be increased to a maximum recommended dose of two tablets daily (1/2 tablet in the morning, 1/2 tablet mid-afternoon and one at bedtime).  Vitamin B6 100mg tablets. Take one tablet twice a day (up to 200 mg per day).   Skin Rashes:  Aveeno products  Benadryl cream or 25mg every 6 hours as needed  Calamine Lotion  1% cortisone cream   Yeast infection:  Gyne-lotrimin 7  Monistat 7    **If taking multiple medications, please check labels to avoid duplicating the same active ingredients  **take medication as directed on the label  ** Do not exceed 4000 mg of tylenol in 24 hours  **Do not take medications that contain aspirin or ibuprofen            

## 2019-06-20 NOTE — Progress Notes (Signed)
   PRENATAL VISIT NOTE  Subjective:  Sonya Cruz is a 28 y.o. G3P1011 at [redacted]w[redacted]d being seen today for her first prenatal visit for this pregnancy.  She is currently monitored for the following issues for this low-risk pregnancy and has Supervision of low-risk pregnancy on their problem list.  Patient reports no complaints.   . Vag. Bleeding: None.  Movement: Absent. Denies leaking of fluid.   She is planning to breastfeed. Desires possible POPs for contraception.   The following portions of the patient's history were reviewed and updated as appropriate: allergies, current medications, past family history, past medical history, past social history, past surgical history and problem list.   Objective:   Vitals:   06/20/19 0859  BP: 114/79  Pulse: 80  Weight: 118 lb (53.5 kg)    Fetal Status: Fetal Heart Rate (bpm): 152   Movement: Absent     General:  Alert, oriented and cooperative. Patient is in no acute distress.  Skin: Skin is warm and dry. No rash noted.   Cardiovascular: Normal heart rate and rhythm noted  Respiratory: Normal respiratory effort, no problems with respiration noted. Clear to auscultation.   Abdomen: Soft, gravid, appropriate for gestational age. Normal bowel sounds. Non-tender. Pain/Pressure: Absent     Pelvic: Cervical exam deferred       Normal cervical contour, no lesions, no bleeding following pap, normal discharge  Extremities: Normal range of motion.  Edema: None  Mental Status: Normal mood and affect. Normal behavior. Normal judgment and thought content.   Assessment and Plan:  Pregnancy: G3P1011 at [redacted]w[redacted]d 1. Encounter for supervision of low-risk pregnancy, antepartum - Obstetric Panel, Including HIV - Culture, OB Urine - Hemoglobin A1c - Declined genetic testing and carrier screening  - Anatomy US already scheduled  - CMA to re-activate Baby Rx app since patient used with last pregnancy - Patient planning to get own BP cuff   Discussed the  normal cadence of Baby Rx visits  Discussed the nature of our practice with multiple providers and delivering provider will be who is on call for the hospital   Preterm labor/second trimester warning symptoms and general obstetric precautions including but not limited to vaginal bleeding, contractions, leaking of fluid and fetal movement were reviewed in detail with the patient. Please refer to After Visit Summary for other counseling recommendations.   Return in about 4 weeks (around 07/18/2019) for LOB, Virtual.  Future Appointments  Date Time Provider [place]  07/09/2019  8:00 AM WH-MFC [place] 3 WH-MFCUS MFC-US    Kerry Hough, PA-C

## 2019-06-20 NOTE — Progress Notes (Signed)
Pap 2 years ago at the [place]. Tailbone pain, started in June 2019 after 1st delivery.

## 2019-06-22 LAB — CULTURE, OB URINE

## 2019-06-22 LAB — URINE CULTURE, OB REFLEX: Organism ID, Bacteria: NO GROWTH

## 2019-06-24 LAB — OBSTETRIC PANEL, INCLUDING HIV
Antibody Screen: NEGATIVE
Basophils Absolute: 0 10*3/uL (ref 0.0–0.2)
Basos: 1 %
EOS (ABSOLUTE): 0.1 10*3/uL (ref 0.0–0.4)
Eos: 1 %
HIV Screen 4th Generation wRfx: NONREACTIVE
Hematocrit: 36.2 % (ref 34.0–46.6)
Hemoglobin: 12.4 g/dL (ref 11.1–15.9)
Hepatitis B Surface Ag: NEGATIVE
Immature Grans (Abs): 0 10*3/uL (ref 0.0–0.1)
Immature Granulocytes: 0 %
Lymphocytes Absolute: 1.1 10*3/uL (ref 0.7–3.1)
Lymphs: 14 %
MCH: 31.4 pg (ref 26.6–33.0)
MCHC: 34.3 g/dL (ref 31.5–35.7)
MCV: 92 fL (ref 79–97)
Monocytes Absolute: 0.3 10*3/uL (ref 0.1–0.9)
Monocytes: 5 %
Neutrophils Absolute: 5.9 10*3/uL (ref 1.4–7.0)
Neutrophils: 79 %
Platelets: 183 10*3/uL (ref 150–450)
RBC: 3.95 x10E6/uL (ref 3.77–5.28)
RDW: 12.5 % (ref 11.7–15.4)
RPR Ser Ql: NONREACTIVE
Rh Factor: POSITIVE
Rubella Antibodies, IGG: 2.89 index (ref >0.99)
WBC: 7.4 10*3/uL (ref 3.4–10.8)

## 2019-06-24 LAB — HEMOGLOBIN A1C
Est. average glucose Bld gHb Est-mCnc: 85 mg/dL
Hgb A1c MFr Bld: 4.6 % — ABNORMAL LOW (ref 4.8–5.6)

## 2019-07-09 ENCOUNTER — Ambulatory Visit (HOSPITAL_COMMUNITY): Payer: Non-veteran care

## 2019-07-16 ENCOUNTER — Telehealth: Payer: Self-pay | Admitting: Obstetrics and Gynecology

## 2019-07-16 ENCOUNTER — Ambulatory Visit (HOSPITAL_COMMUNITY)
Admission: RE | Admit: 2019-07-16 | Discharge: 2019-07-16 | Disposition: A | Discharge status: Home / Self Care | Payer: Self-pay | Source: Ambulatory Visit | Attending: Obstetrics and Gynecology | Admitting: Obstetrics and Gynecology

## 2019-07-16 ENCOUNTER — Other Ambulatory Visit (HOSPITAL_COMMUNITY): Payer: Self-pay | Admitting: *Deleted

## 2019-07-16 ENCOUNTER — Other Ambulatory Visit: Payer: Self-pay

## 2019-07-16 DIAGNOSIS — Z3A20 20 weeks gestation of pregnancy: Secondary | ICD-10-CM

## 2019-07-16 DIAGNOSIS — Z363 Encounter for antenatal screening for malformations: Secondary | ICD-10-CM

## 2019-07-16 DIAGNOSIS — Z362 Encounter for other antenatal screening follow-up: Secondary | ICD-10-CM

## 2019-07-16 DIAGNOSIS — Z3490 Encounter for supervision of normal pregnancy, unspecified, unspecified trimester: Secondary | ICD-10-CM | POA: Insufficient documentation

## 2019-07-16 NOTE — Telephone Encounter (Signed)
Spoke with patient about her appointment on 10/1 @ 9:35. Patient instructed that the appointment is a mychart visit. Patient instructed to download the mychart app if not already done so. Patient verbalized she has the app downloaded. Patient instructed that a nurse will be calling her around her appointment time.

## 2019-07-17 ENCOUNTER — Telehealth (INDEPENDENT_AMBULATORY_CARE_PROVIDER_SITE_OTHER): Payer: Self-pay | Admitting: Obstetrics and Gynecology

## 2019-07-17 DIAGNOSIS — Z3A20 20 weeks gestation of pregnancy: Secondary | ICD-10-CM

## 2019-07-17 DIAGNOSIS — Z3490 Encounter for supervision of normal pregnancy, unspecified, unspecified trimester: Secondary | ICD-10-CM

## 2019-07-17 DIAGNOSIS — Z3492 Encounter for supervision of normal pregnancy, unspecified, second trimester: Secondary | ICD-10-CM

## 2019-07-17 NOTE — Progress Notes (Signed)
I connected with  Sonya Cruz on 07/17/19 at  9:35 AM EDT by telephone and verified that I am speaking with the correct person using two identifiers.   I discussed the limitations, risks, security and privacy concerns of performing an evaluation and management service by telephone and the availability of in person appointments. I also discussed with the patient that there may be a patient responsible charge related to this service. The patient expressed understanding and agreed to proceed.  [place], [place] 07/17/2019  9:24 AM

## 2019-07-17 NOTE — Progress Notes (Signed)
   TELEHEALTH VIRTUAL OBSTETRICS VISIT ENCOUNTER NOTE  I connected with Sonya Cruz on 07/17/19 at  9:35 AM EDT by telephone at home and verified that I am speaking with the correct person using two identifiers.   I discussed the limitations, risks, security and privacy concerns of performing an evaluation and management service by telephone and the availability of in person appointments. I also discussed with the patient that there may be a patient responsible charge related to this service. The patient expressed understanding and agreed to proceed.  Subjective:  Sonya Cruz is a 28 y.o. G3P1011 at [redacted]w[redacted]d being followed for ongoing prenatal care.  She is currently monitored for the following issues for this low-risk pregnancy and has Supervision of low-risk pregnancy on their problem list.  Patient reports no complaints. Reports fetal movement. Denies any contractions, bleeding or leaking of fluid.   The following portions of the patient's history were reviewed and updated as appropriate: allergies, current medications, past family history, past medical history, past social history, past surgical history and problem list.   Objective:   General:  Alert, oriented and cooperative.   Mental Status: Normal mood and affect perceived. Normal judgment and thought content.  Rest of physical exam deferred due to type of encounter  Assessment and Plan:  Pregnancy: G3P1011 at [redacted]w[redacted]d 1. Encounter for supervision of low-risk pregnancy, antepartum  Noticed that her urine has an odor. She will come next week to bring in a urine sample Forgot to get BP cuff, will order one today and log it in baby scripts.  Excited about BOY!   Preterm labor symptoms and general obstetric precautions including but not limited to vaginal bleeding, contractions, leaking of fluid and fetal movement were reviewed in detail with the patient.  I discussed the assessment and treatment plan with the patient. The  patient was provided an opportunity to ask questions and all were answered. The patient agreed with the plan and demonstrated an understanding of the instructions. The patient was advised to call back or seek an in-person office evaluation/go to MAU at [facility] for any urgent or concerning symptoms. Please refer to After Visit Summary for other counseling recommendations.   I provided 10 minutes of non-face-to-face time during this encounter.  Return She needs to come for a Urine this week for next., for 4 weeks for virtual visit .  Future Appointments  Date Time Provider [place]  08/13/2019  1:45 PM WH-MFC [place] 2 WH-MFCUS MFC-US     , [place] [facility], [place]

## 2019-07-21 ENCOUNTER — Ambulatory Visit (INDEPENDENT_AMBULATORY_CARE_PROVIDER_SITE_OTHER): Payer: Self-pay

## 2019-07-21 ENCOUNTER — Other Ambulatory Visit: Payer: Self-pay

## 2019-07-21 VITALS — BP 110/74 | HR 70 | Wt 122.6 lb

## 2019-07-21 DIAGNOSIS — N898 Other specified noninflammatory disorders of vagina: Secondary | ICD-10-CM

## 2019-07-21 DIAGNOSIS — Z113 Encounter for screening for infections with a predominantly sexual mode of transmission: Secondary | ICD-10-CM

## 2019-07-21 DIAGNOSIS — R8290 Unspecified abnormal findings in urine: Secondary | ICD-10-CM

## 2019-07-21 LAB — POCT URINALYSIS DIP (DEVICE)
Bilirubin Urine: NEGATIVE
Glucose, UA: NEGATIVE mg/dL
Hgb urine dipstick: NEGATIVE
Ketones, ur: NEGATIVE mg/dL
Leukocytes,Ua: NEGATIVE
Nitrite: NEGATIVE
Protein, ur: NEGATIVE mg/dL
Specific Gravity, Urine: <=1.005 (ref 1.005–1.030)
Urobilinogen, UA: 0.2 mg/dL (ref 0.0–1.0)
pH: 5.5 (ref 5.0–8.0)

## 2019-07-21 NOTE — Progress Notes (Signed)
I discussed the plan of care with the nurse as documented above.

## 2019-07-21 NOTE — Progress Notes (Signed)
Pt presents to office today for uninalysis due to abnormal urine odor. Urine collected and urinalysis completed; results not suspicious for UTI. Patient denies any burning, pain, frequency, or incomplete emptying of bladder.   Discussed results of urinalysis with [place], DO who recomends further testing for vaginal infection. Pt notified of provider's recommendation and self swab instructions given. Specimen obtained. Notified pt we will contact her with any abnormal results in 24-48 hrs.  Pt verbalizes understanding and will contact the office via MyChart or phone if her symptoms change.   Apolonio Schneiders RN 07/21/19

## 2019-07-28 LAB — CERVICOVAGINAL ANCILLARY ONLY
Bacterial Vaginitis (gardnerella): NEGATIVE
Candida Glabrata: NEGATIVE
Candida Vaginitis: NEGATIVE
Chlamydia: NEGATIVE
Neisseria Gonorrhea: NEGATIVE
Trichomonas: NEGATIVE

## 2019-08-13 ENCOUNTER — Other Ambulatory Visit: Payer: Self-pay

## 2019-08-13 ENCOUNTER — Ambulatory Visit (HOSPITAL_COMMUNITY)
Admission: RE | Admit: 2019-08-13 | Discharge: 2019-08-13 | Disposition: A | Discharge status: Home / Self Care | Payer: No Typology Code available for payment source | Source: Ambulatory Visit | Attending: Obstetrics and Gynecology | Admitting: Obstetrics and Gynecology

## 2019-08-13 ENCOUNTER — Other Ambulatory Visit (HOSPITAL_COMMUNITY): Payer: Self-pay | Admitting: *Deleted

## 2019-08-13 DIAGNOSIS — Z362 Encounter for other antenatal screening follow-up: Secondary | ICD-10-CM

## 2019-08-13 DIAGNOSIS — Z3A24 24 weeks gestation of pregnancy: Secondary | ICD-10-CM

## 2019-08-13 DIAGNOSIS — Z3689 Encounter for other specified antenatal screening: Secondary | ICD-10-CM

## 2019-08-14 ENCOUNTER — Telehealth (INDEPENDENT_AMBULATORY_CARE_PROVIDER_SITE_OTHER): Payer: Self-pay | Admitting: Obstetrics & Gynecology

## 2019-08-14 ENCOUNTER — Encounter: Payer: Self-pay | Admitting: Obstetrics & Gynecology

## 2019-08-14 DIAGNOSIS — Z3A24 24 weeks gestation of pregnancy: Secondary | ICD-10-CM

## 2019-08-14 DIAGNOSIS — Z3492 Encounter for supervision of normal pregnancy, unspecified, second trimester: Secondary | ICD-10-CM

## 2019-08-14 NOTE — Progress Notes (Signed)
TELEHEALTH OBSTETRICS PRENATAL VIRTUAL VIDEO VISIT ENCOUNTER NOTE  Provider location: [facility] [employer] [facility] [place]   I connected with Sonya Cruz on 08/14/19 at  1:15 PM EDT by MyChart Video Encounter at home and verified that I am speaking with the correct person using two identifiers.   I discussed the limitations, risks, security and privacy concerns of performing an evaluation and management service virtually and the availability of in person appointments. I also discussed with the patient that there may be a patient responsible charge related to this service. The patient expressed understanding and agreed to proceed. Subjective:  Sonya Cruz is a 28 y.o. G3P1011 at [redacted]w[redacted]d being seen today for ongoing prenatal care.  She is currently monitored for the following issues for this low-risk pregnancy and has Supervision of low-risk pregnancy on their problem list.  Patient reports no complaints.  Contractions: Not present. Vag. Bleeding: None.  Movement: Present. Denies any leaking of fluid.   The following portions of the patient's history were reviewed and updated as appropriate: allergies, current medications, past family history, past medical history, past social history, past surgical history and problem list.   Objective:  There were no vitals filed for this visit. BP: will check tonight  Fetal Status:     Movement: Present     General:  Alert, oriented and cooperative. Patient is in no acute distress.  Respiratory: Normal respiratory effort, no problems with respiration noted  Mental Status: Normal mood and affect. Normal behavior. Normal judgment and thought content.  Rest of physical exam deferred due to type of encounter  Imaging: [place] Mfm Ob Comp + 14 Wk  Result Date: 07/17/2019 ----------------------------------------------------------------------  OBSTETRICS REPORT                        (Signed Final 07/17/2019 08:03 am)  ---------------------------------------------------------------------- Patient Info  ID #:       [id number]                          D.O.B.:  12/20/1990 (28 yrs)  Name:       Sonya Cruz             Visit Date: 07/16/2019 03:18 pm ---------------------------------------------------------------------- Performed By  Performed By:     Sonya Cruz        Ref. Address:      [address]                    [place]                                                              [address]  Attending:        Lin Cruz      [facility]  Referred By:  [facility] ---------------------------------------------------------------------- Orders   #  Description                          Code         Ordered By   1  [place] MFM OB COMP + 14 WK               [id number]     Sonya Cruz  ----------------------------------------------------------------------   #  Order #                    Accession #                 Episode #   1  [id number]                  [id number]                  [id number]  ---------------------------------------------------------------------- Indications   Encounter for antenatal screening for          Z36.3   malformations   [redacted] weeks gestation of pregnancy                Z3A.20  ---------------------------------------------------------------------- Fetal Evaluation  Num Of Fetuses:          1  Fetal Heart Rate(bpm):   153  Cardiac Activity:        Observed  Presentation:            Breech  Placenta:                Anterior  Amniotic Fluid  AFI FV:      Within normal limits                              Largest Pocket(cm)                              4.01 ---------------------------------------------------------------------- Biometry  BPD:      46.7  mm     G. Age:  20w 1d         36  %    CI:        72.39   %    70 - 86                                                          FL/HC:       18.5  %    16.8 - 19.8   HC:      174.6  mm     G. Age:  20w 0d         22  %    HC/AC:       1.20       1.09 - 1.39  AC:      145.5  mm     G. Age:  19w 6d         26  %    FL/BPD:      69.2  %  FL:       32.3  mm     G. Age:  53w 0d  29  %    FL/AC:       22.2  %    20 - 24  HUM:      31.1  mm     G. Age:  20w 2d         48  %  CER:      20.1  mm     G. Age:  19w 1d         20  %  LV:        6.3  mm  CM:        3.7  mm  Est. FW:     324   gm   0 lb 11 oz      22  % ---------------------------------------------------------------------- OB History  Gravidity:    3         Term:   1  TOP:          1        Living:  1 ---------------------------------------------------------------------- Gestational Age  LMP:           20w 3d        Date:  02/23/19                 EDD:   11/30/19  U/S Today:     20w 0d                                        EDD:   12/03/19  Best:          Sonya Cruz 3d     Det. By:  LMP  (02/23/19)          EDD:   11/30/19 ---------------------------------------------------------------------- Anatomy  Cranium:               Appears normal         LVOT:                   Appears normal  Cavum:                 Appears normal         Aortic Arch:            Not well visualized  Ventricles:            Appears normal         Ductal Arch:            Appears normal  Choroid Plexus:        Appears normal         Diaphragm:              Appears normal  Cerebellum:            Appears normal         Stomach:                Appears normal, left                                                                        sided  Posterior Fossa:       Appears  normal         Abdomen:                Appears normal  Nuchal Fold:           Appears normal         Abdominal Wall:         Appears nml (cord                                                                        insert, abd wall)  Face:                  Appears normal         Cord Vessels:           Appears normal (3                         (orbits and profile)                            vessel cord)  Lips:                  Appears normal         Kidneys:                Appear normal  Palate:                Appears normal         Bladder:                Appears normal  Thoracic:              Appears normal         Spine:                  Not well visualized  Heart:                 Appears normal         Upper Extremities:      Appears normal                         (4CH, axis, and                         situs)  RVOT:                  Appears normal         Lower Extremities:      Appears normal  Other:  Nasal bone visualized. Heels and 5th digit visualized. (Female - noted in          an envelope) ---------------------------------------------------------------------- Cervix Uterus Adnexa  Cervix  Length:           3.07  cm.  Normal appearance by transabdominal scan.  Left Ovary  Within normal limits.  Right Ovary  Not visualized. ---------------------------------------------------------------------- Impression  Normal interval growth.  No ultrasonic evidence of structural  fetal anomalies.  Suboptimal views of the fetal anatomy was obtained  secondary to fetal position ---------------------------------------------------------------------- Recommendations  Follow up anatomy in 4 weeks. ----------------------------------------------------------------------               Sonya Landsman, MD Electronically Signed Final Report   07/17/2019 08:03 am ----------------------------------------------------------------------  [place] Mfm Ob Follow Up  Result Date: 08/13/2019 ----------------------------------------------------------------------  OBSTETRICS REPORT                       (Signed Final 08/13/2019 02:42 pm) ---------------------------------------------------------------------- Patient Info  ID #:       [id number]                          D.O.B.:  November 07, 1990 (28 yrs)  Name:       Sonya Cruz             Visit Date: 08/13/2019 01:45 pm  ---------------------------------------------------------------------- Performed By  Performed By:     Sandi Mealy        Ref. Address:     [address]                    [place]                                                             [address]  Attending:        Lin Cruz      [facility]  Referred By:      [facility] ---------------------------------------------------------------------- Orders   #  Description                          Code         Ordered By   1  [place] MFM OB FOLLOW UP                  40981.19     Sonya Cruz  ----------------------------------------------------------------------   #  Order #                    Accession #                 Episode #   1  [id number]                  [id number]                  [id number]  ---------------------------------------------------------------------- Indications   Encounter for other antenatal screening        Z36.2   follow-up   [redacted] weeks  gestation of pregnancy                Z3A.24  ---------------------------------------------------------------------- Fetal Evaluation  Num Of Fetuses:         1  Fetal Heart Rate(bpm):  150  Cardiac Activity:       Observed  Presentation:           Breech  Placenta:               Anterior  P. Cord Insertion:      Previously Visualized  Amniotic Fluid  AFI FV:      Within normal limits                              Largest Pocket(cm)                              3.82 ---------------------------------------------------------------------- Biometry  BPD:      52.9  mm     G. Age:  22w 0d        < 1  %    CI:        65.63   %    70 - 86                                                          FL/HC:      19.2   %    18.7 - 20.9  HC:      209.6  mm     G. Age:  23w 0d          3  %    HC/AC:      1.07        1.05 - 1.21  AC:      196.2  mm     G. Age:  24w 2d          37  %    FL/BPD:     76.0   %    71 - 87  FL:       40.2  mm     G. Age:  23w 0d          6  %    FL/AC:      20.5   %    20 - 24  HUM:      38.1  mm     G. Age:  23w 3d         19  %  LV:        4.2  mm  Est. FW:     608  gm      1 lb 5 oz     11  % ---------------------------------------------------------------------- OB History  Gravidity:    3         Term:   1  TOP:          1        Living:  1 ---------------------------------------------------------------------- Gestational Age  LMP:           24w 3d        Date:  02/23/19                 EDD:  11/30/19  U/S Today:     23w 1d                                        EDD:   12/09/19  Best:          24w 3d     Det. By:  LMP  (02/23/19)          EDD:   11/30/19 ---------------------------------------------------------------------- Anatomy  Cranium:               Appears normal         Aortic Arch:            Appears normal  Cavum:                 Appears normal         Ductal Arch:            Previously seen  Ventricles:            Appears normal         Diaphragm:              Appears normal  Choroid Plexus:        Previously seen        Stomach:                Appears normal, left                                                                        sided  Cerebellum:            Previously seen        Abdomen:                Appears normal  Posterior Fossa:       Previously seen        Abdominal Wall:         Previously seen  Nuchal Fold:           Not applicable (>20    Cord Vessels:           Previously seen                         wks GA)  Face:                  Orbits and profile     Kidneys:                Appear normal                         previously seen  Lips:                  Previously seen        Bladder:                Appears normal  Thoracic:              Appears normal         Spine:  Limited views                                                                        appear normal  Heart:                 Previously seen         Upper Extremities:      Previously seen  RVOT:                  Previously seen        Lower Extremities:      Previously seen  LVOT:                  Previously seen ---------------------------------------------------------------------- Cervix Uterus Adnexa  Cervix  Length:           2.58  cm.  Normal appearance by transabdominal scan. ---------------------------------------------------------------------- Impression  Normal interval growth.  Normal amniotic fluid volume.  Suboptimal views of the fetal spine again seen.  EFW is at the 11th% but [facility] is normal. ---------------------------------------------------------------------- Recommendations  Follow up growth in 4 weeks to clear spine and to assess  fetal growth trend. ----------------------------------------------------------------------               Sonya Landsman, MD Electronically Signed Final Report   08/13/2019 02:42 pm ----------------------------------------------------------------------   Assessment and Plan:  Pregnancy: G3P1011 at [redacted]w[redacted]d 1. Encounter for supervision of low-risk pregnancy in second trimester 08/13/2019 EFW is at the 11th% but [facility] is normal. +FM Will recheck BPO tonight. Has logged resutls into Babyscripts.  Reviewed 2 hour GTT  Need 2hr GTT and 28 week labs at next visit  Preterm labor symptoms and general obstetric precautions including but not limited to vaginal bleeding, contractions, leaking of fluid and fetal movement were reviewed in detail with the patient. I discussed the assessment and treatment plan with the patient. The patient was provided an opportunity to ask questions and all were answered. The patient agreed with the plan and demonstrated an understanding of the instructions. The patient was advised to call back or seek an in-person office evaluation/go to [facility]U at [facility] for any urgent or concerning symptoms. Please refer to After Visit Summary for other counseling recommendations.    I provided 8 minutes of face-to-face time during this encounter.  No follow-ups on file.  Future Appointments  Date Time Provider Department Center  09/10/2019  1:45 PM WH-MFC NURSE WH-MFC MFC-US  09/10/2019  1:45 PM WH-MFC [place] 5 WH-MFCUS MFC-US    Willodean Rosenthal, MD Center for [facility], [facility]

## 2019-09-09 ENCOUNTER — Other Ambulatory Visit: Payer: Self-pay | Admitting: *Deleted

## 2019-09-09 DIAGNOSIS — Z3490 Encounter for supervision of normal pregnancy, unspecified, unspecified trimester: Secondary | ICD-10-CM

## 2019-09-10 ENCOUNTER — Other Ambulatory Visit (HOSPITAL_COMMUNITY): Payer: Self-pay | Admitting: *Deleted

## 2019-09-10 ENCOUNTER — Ambulatory Visit (HOSPITAL_COMMUNITY): Payer: Self-pay | Admitting: *Deleted

## 2019-09-10 ENCOUNTER — Other Ambulatory Visit: Payer: Self-pay

## 2019-09-10 ENCOUNTER — Encounter (HOSPITAL_COMMUNITY): Payer: Self-pay

## 2019-09-10 ENCOUNTER — Ambulatory Visit (HOSPITAL_COMMUNITY)
Admission: RE | Admit: 2019-09-10 | Discharge: 2019-09-10 | Disposition: A | Discharge status: Home / Self Care | Payer: Self-pay | Source: Ambulatory Visit | Attending: Obstetrics and Gynecology | Admitting: Obstetrics and Gynecology

## 2019-09-10 ENCOUNTER — Other Ambulatory Visit (HOSPITAL_COMMUNITY): Payer: Self-pay | Admitting: Maternal & Fetal Medicine

## 2019-09-10 DIAGNOSIS — Z3492 Encounter for supervision of normal pregnancy, unspecified, second trimester: Secondary | ICD-10-CM

## 2019-09-10 DIAGNOSIS — Z3A28 28 weeks gestation of pregnancy: Secondary | ICD-10-CM

## 2019-09-10 DIAGNOSIS — Z362 Encounter for other antenatal screening follow-up: Secondary | ICD-10-CM

## 2019-09-10 DIAGNOSIS — O365990 Maternal care for other known or suspected poor fetal growth, unspecified trimester, not applicable or unspecified: Secondary | ICD-10-CM

## 2019-09-10 DIAGNOSIS — Z3689 Encounter for other specified antenatal screening: Secondary | ICD-10-CM | POA: Insufficient documentation

## 2019-09-10 DIAGNOSIS — O365930 Maternal care for other known or suspected poor fetal growth, third trimester, not applicable or unspecified: Secondary | ICD-10-CM

## 2019-09-15 ENCOUNTER — Other Ambulatory Visit: Payer: Self-pay

## 2019-09-15 ENCOUNTER — Ambulatory Visit (INDEPENDENT_AMBULATORY_CARE_PROVIDER_SITE_OTHER): Payer: Self-pay | Admitting: Family Medicine

## 2019-09-15 VITALS — BP 103/70 | HR 91 | Wt 126.0 lb

## 2019-09-15 DIAGNOSIS — O365990 Maternal care for other known or suspected poor fetal growth, unspecified trimester, not applicable or unspecified: Secondary | ICD-10-CM | POA: Insufficient documentation

## 2019-09-15 DIAGNOSIS — Z3A29 29 weeks gestation of pregnancy: Secondary | ICD-10-CM

## 2019-09-15 DIAGNOSIS — O0990 Supervision of high risk pregnancy, unspecified, unspecified trimester: Secondary | ICD-10-CM

## 2019-09-15 DIAGNOSIS — Z3490 Encounter for supervision of normal pregnancy, unspecified, unspecified trimester: Secondary | ICD-10-CM

## 2019-09-15 DIAGNOSIS — O0993 Supervision of high risk pregnancy, unspecified, third trimester: Secondary | ICD-10-CM

## 2019-09-15 DIAGNOSIS — O365930 Maternal care for other known or suspected poor fetal growth, third trimester, not applicable or unspecified: Secondary | ICD-10-CM

## 2019-09-15 NOTE — Progress Notes (Signed)
Insurance wont cover Flu or tdap will go to [facility] and get it.

## 2019-09-15 NOTE — Progress Notes (Signed)
   PRENATAL VISIT NOTE  Subjective:  Sonya Cruz is a 28 y.o. G3P1011 at [redacted]w[redacted]d being seen today for ongoing prenatal care.  She is currently monitored for the following issues for this high-risk pregnancy and has Supervision of high risk pregnancy, antepartum and Maternal care for restricted fetal growth, antepartum on their problem list.  Patient reports no complaints.  Contractions: Not present. Vag. Bleeding: None.  Movement: Present. Denies leaking of fluid.   The following portions of the patient's history were reviewed and updated as appropriate: allergies, current medications, past family history, past medical history, past social history, past surgical history and problem list.   Objective:   Vitals:   09/15/19 0822  BP: 103/70  Pulse: 91  Weight: 126 lb (57.2 kg)    Fetal Status: Fetal Heart Rate (bpm): 140   Movement: Present     General:  Alert, oriented and cooperative. Patient is in no acute distress.  Skin: Skin is warm and dry. No rash noted.   Cardiovascular: Normal heart rate noted  Respiratory: Normal respiratory effort, no problems with respiration noted  Abdomen: Soft, gravid, appropriate for gestational age.  Pain/Pressure: Absent     Pelvic: Cervical exam deferred        Extremities: Normal range of motion.  Edema: None  Mental Status: Normal mood and affect. Normal behavior. Normal judgment and thought content.   Assessment and Plan:  Pregnancy: G3P1011 at [redacted]w[redacted]d 1. Supervision of high risk pregnancy, antepartum FHT normal, FH measuring small.  2. Maternal care for restricted fetal growth, antepartum FGR: [place] today: AC 2%, EFW 3% Antenatal testing. Continue serial [place]  Preterm labor symptoms and general obstetric precautions including but not limited to vaginal bleeding, contractions, leaking of fluid and fetal movement were reviewed in detail with the patient. Please refer to After Visit Summary for other counseling recommendations.   No  follow-ups on file.  Future Appointments  Date Time Provider [place]  09/15/2019  9:30 AM WOC-WOCA LAB WOC-WOCA WOC  09/25/2019 12:30 PM [place] MFC-US  09/25/2019 12:30 PM WH-MFC [place] 1 WH-MFCUS MFC-US  10/02/2019  9:45 AM WH-MFC NURSE [place] MFC-US  10/02/2019  9:45 AM WH-MFC [place] 2 WH-MFCUS MFC-US    Truett Mainland, DO

## 2019-09-16 LAB — CBC
Hematocrit: 35.1 % (ref 34.0–46.6)
Hemoglobin: 11.8 g/dL (ref 11.1–15.9)
MCH: 31.1 pg (ref 26.6–33.0)
MCHC: 33.6 g/dL (ref 31.5–35.7)
MCV: 93 fL (ref 79–97)
Platelets: 213 10*3/uL (ref 150–450)
RBC: 3.79 x10E6/uL (ref 3.77–5.28)
RDW: 12.3 % (ref 11.7–15.4)
WBC: 9.1 10*3/uL (ref 3.4–10.8)

## 2019-09-16 LAB — GLUCOSE TOLERANCE, 2 HOURS W/ 1HR
Glucose, 1 hour: 104 mg/dL (ref 65–179)
Glucose, 2 hour: 91 mg/dL (ref 65–152)
Glucose, Fasting: 75 mg/dL (ref 65–91)

## 2019-09-16 LAB — RPR: RPR Ser Ql: NONREACTIVE

## 2019-09-16 LAB — HIV ANTIBODY (ROUTINE TESTING W REFLEX): HIV Screen 4th Generation wRfx: NONREACTIVE

## 2019-09-25 ENCOUNTER — Ambulatory Visit (HOSPITAL_COMMUNITY)
Admission: RE | Admit: 2019-09-25 | Discharge: 2019-09-25 | Disposition: A | Discharge status: Home / Self Care | Payer: Self-pay | Source: Ambulatory Visit | Attending: Obstetrics and Gynecology | Admitting: Obstetrics and Gynecology

## 2019-09-25 ENCOUNTER — Other Ambulatory Visit: Payer: Self-pay

## 2019-09-25 ENCOUNTER — Ambulatory Visit (HOSPITAL_COMMUNITY): Payer: Self-pay | Admitting: *Deleted

## 2019-09-25 ENCOUNTER — Encounter (HOSPITAL_COMMUNITY): Payer: Self-pay

## 2019-09-25 DIAGNOSIS — Z3A30 30 weeks gestation of pregnancy: Secondary | ICD-10-CM

## 2019-09-25 DIAGNOSIS — O0990 Supervision of high risk pregnancy, unspecified, unspecified trimester: Secondary | ICD-10-CM | POA: Insufficient documentation

## 2019-09-25 DIAGNOSIS — O365990 Maternal care for other known or suspected poor fetal growth, unspecified trimester, not applicable or unspecified: Secondary | ICD-10-CM | POA: Insufficient documentation

## 2019-09-25 DIAGNOSIS — O365930 Maternal care for other known or suspected poor fetal growth, third trimester, not applicable or unspecified: Secondary | ICD-10-CM

## 2019-09-30 ENCOUNTER — Other Ambulatory Visit: Payer: Self-pay

## 2019-09-30 ENCOUNTER — Telehealth (INDEPENDENT_AMBULATORY_CARE_PROVIDER_SITE_OTHER): Payer: Self-pay | Admitting: Obstetrics and Gynecology

## 2019-09-30 DIAGNOSIS — O0990 Supervision of high risk pregnancy, unspecified, unspecified trimester: Secondary | ICD-10-CM

## 2019-09-30 DIAGNOSIS — O365990 Maternal care for other known or suspected poor fetal growth, unspecified trimester, not applicable or unspecified: Secondary | ICD-10-CM

## 2019-09-30 DIAGNOSIS — O0993 Supervision of high risk pregnancy, unspecified, third trimester: Secondary | ICD-10-CM

## 2019-09-30 DIAGNOSIS — O365930 Maternal care for other known or suspected poor fetal growth, third trimester, not applicable or unspecified: Secondary | ICD-10-CM

## 2019-09-30 DIAGNOSIS — Z3A31 31 weeks gestation of pregnancy: Secondary | ICD-10-CM

## 2019-09-30 NOTE — Progress Notes (Signed)
   TELEHEALTH VIRTUAL OBSTETRICS VISIT ENCOUNTER NOTE  Clinic: [facility]  I connected with Sonya Cruz on 09/30/19 at  9:15 AM EST by telephone at home and verified that I am speaking with the correct person using two identifiers.   I discussed the limitations, risks, security and privacy concerns of performing an evaluation and management service by telephone and the availability of in person appointments. I also discussed with the patient that there may be a patient responsible charge related to this service. The patient expressed understanding and agreed to proceed.  Subjective:  Sonya Cruz is a 28 y.o. G3P1011 at [redacted]w[redacted]d being followed for ongoing prenatal care.  She is currently monitored for the following issues for this high-risk pregnancy and has Supervision of high risk pregnancy, antepartum and Maternal care for restricted fetal growth, antepartum on their problem list.  Patient reports no complaints. Reports fetal movement. Denies any contractions, bleeding or leaking of fluid.   The following portions of the patient's history were reviewed and updated as appropriate: allergies, current medications, past family history, past medical history, past social history, past surgical history and problem list.   Objective:  There were no vitals filed for this visit.  Babyscripts Data Reviewed: yes  General:  Alert, oriented and cooperative.   Mental Status: Normal mood and affect perceived. Normal judgment and thought content.  Rest of physical exam deferred due to type of encounter  Assessment and Plan:  Pregnancy: G3P1011 at [redacted]w[redacted]d 1. Supervision of high risk pregnancy, antepartum Normal bp on 12/10 with ua doppler visit (normal dopplers). Has rpt and growth for thursday  2. Maternal care for restricted fetal growth, antepartum  Preterm labor symptoms and general obstetric precautions including but not limited to vaginal bleeding,  contractions, leaking of fluid and fetal movement were reviewed in detail with the patient.  I discussed the assessment and treatment plan with the patient. The patient was provided an opportunity to ask questions and all were answered. The patient agreed with the plan and demonstrated an understanding of the instructions. The patient was advised to call back or seek an in-person office evaluation/go to MAU at [facility] for any urgent or concerning symptoms. Please refer to After Visit Summary for other counseling recommendations.   I provided 10 minutes of non-face-to-face time during this encounter. The visit was conducted via MyChart-medicine  No follow-ups on file.  Future Appointments  Date Time Provider [place]  09/30/2019  9:15 AM Aletha Halim, MD [facility] WOC  10/02/2019  9:45 AM WH-MFC NURSE [place] MFC-US  10/02/2019  9:45 AM [place] 2 WH-MFCUS MFC-US    [place] [facility], [place]

## 2019-09-30 NOTE — Progress Notes (Signed)
I connected with  Sonya Cruz on 09/30/19 at  9:15 AM EST by telephone and verified that I am speaking with the correct person using two identifiers.   I discussed the limitations, risks, security and privacy concerns of performing an evaluation and management service by telephone and the availability of in person appointments. I also discussed with the patient that there may be a patient responsible charge related to this service. The patient expressed understanding and agreed to proceed.  [place], CMA 09/30/2019  8:54 AM   Will update Babyscripts later with BP.

## 2019-10-02 ENCOUNTER — Ambulatory Visit (HOSPITAL_COMMUNITY): Payer: No Typology Code available for payment source | Admitting: *Deleted

## 2019-10-02 ENCOUNTER — Other Ambulatory Visit: Payer: Self-pay

## 2019-10-02 ENCOUNTER — Ambulatory Visit (HOSPITAL_COMMUNITY)
Admission: RE | Admit: 2019-10-02 | Discharge: 2019-10-02 | Disposition: A | Discharge status: Home / Self Care | Payer: No Typology Code available for payment source | Source: Ambulatory Visit | Attending: Obstetrics and Gynecology | Admitting: Obstetrics and Gynecology

## 2019-10-02 ENCOUNTER — Other Ambulatory Visit (HOSPITAL_COMMUNITY): Payer: Self-pay | Admitting: *Deleted

## 2019-10-02 ENCOUNTER — Encounter (HOSPITAL_COMMUNITY): Payer: Self-pay

## 2019-10-02 DIAGNOSIS — O365990 Maternal care for other known or suspected poor fetal growth, unspecified trimester, not applicable or unspecified: Secondary | ICD-10-CM

## 2019-10-02 DIAGNOSIS — O0990 Supervision of high risk pregnancy, unspecified, unspecified trimester: Secondary | ICD-10-CM

## 2019-10-02 DIAGNOSIS — Z3A31 31 weeks gestation of pregnancy: Secondary | ICD-10-CM

## 2019-10-02 DIAGNOSIS — O365930 Maternal care for other known or suspected poor fetal growth, third trimester, not applicable or unspecified: Secondary | ICD-10-CM

## 2019-10-02 DIAGNOSIS — Z362 Encounter for other antenatal screening follow-up: Secondary | ICD-10-CM

## 2019-10-16 ENCOUNTER — Encounter (HOSPITAL_COMMUNITY): Payer: Self-pay

## 2019-10-16 ENCOUNTER — Ambulatory Visit (HOSPITAL_COMMUNITY): Payer: No Typology Code available for payment source | Admitting: *Deleted

## 2019-10-16 ENCOUNTER — Other Ambulatory Visit (HOSPITAL_COMMUNITY): Payer: Self-pay | Admitting: Obstetrics and Gynecology

## 2019-10-16 ENCOUNTER — Ambulatory Visit (HOSPITAL_COMMUNITY)
Admission: RE | Admit: 2019-10-16 | Discharge: 2019-10-16 | Disposition: A | Discharge status: Home / Self Care | Payer: No Typology Code available for payment source | Source: Ambulatory Visit | Attending: Obstetrics and Gynecology | Admitting: Obstetrics and Gynecology

## 2019-10-16 ENCOUNTER — Other Ambulatory Visit: Payer: Self-pay

## 2019-10-16 DIAGNOSIS — O365990 Maternal care for other known or suspected poor fetal growth, unspecified trimester, not applicable or unspecified: Secondary | ICD-10-CM

## 2019-10-16 DIAGNOSIS — O0990 Supervision of high risk pregnancy, unspecified, unspecified trimester: Secondary | ICD-10-CM

## 2019-10-16 DIAGNOSIS — O365930 Maternal care for other known or suspected poor fetal growth, third trimester, not applicable or unspecified: Secondary | ICD-10-CM | POA: Diagnosis not present

## 2019-10-16 DIAGNOSIS — Z3A33 33 weeks gestation of pregnancy: Secondary | ICD-10-CM | POA: Diagnosis not present

## 2019-10-16 NOTE — Procedures (Signed)
Siyah Mault 05-Jun-1991 [redacted]w[redacted]d  Fetus A Non-Stress Test Interpretation for 10/16/19  Indication: Unsatisfactory BPP  Fetal Heart Rate A Mode: External Baseline Rate (A): 130 bpm Variability: Moderate Accelerations: 15 x 15 Decelerations: None Multiple birth?: No  Uterine Activity Mode: Palpation, Toco Contraction Frequency (min): none Resting Tone Palpated: Relaxed Resting Time: Adequate  Interpretation (Fetal Testing) Nonstress Test Interpretation: Reactive Comments: Reviewed tracing with Dr. Annamaria Boots

## 2019-10-17 NOTE — L&D Delivery Note (Signed)
OB/GYN Faculty Practice Delivery Note  Sonya Cruz is a 29 y.o. [id number] s/p NSVD at [redacted]w[redacted]d. She was admitted for IOL for IUGR and post-dates.   ROM: 2h 59m with clear fluid GBS Status: --/NEGATIVE (02/22 0119) PCR, culture still pending Maximum Maternal Temperature: 98.0 F    Labor Progress: . Patient arrived at 3 cm dilation and was induced with pitocin and rapidly progressed to fully dilated.   Delivery Date/Time: 12/08/2019 at 0455 Delivery: Called to room and patient was complete and desired to start pushing. Head delivered in direct OA position. No nuchal cord present. Shoulder and body delivered in usual fashion. Infant with spontaneous cry, placed on mother's abdomen, dried and stimulated. Cord clamped x 2 after 1-minute delay, and cut by FOB. Cord blood drawn. Placenta delivered spontaneously with gentle cord traction. Fundus firm with massage and Pitocin. Labia, perineum, vagina, and cervix inspected with hemostatic R periuretheral that was not repaired.   Placenta: 3v intact, to L&D Complications: none Lacerations: hemostatic R periuretheral that was not repaired EBL: 50 cc Analgesia: epidural   Infant: APGAR (1 MIN): 9   APGAR (5 MINS): 9    Weight: 3090 grams  Zack Seal, MD/[facility] OB/GYN Fellow, Faculty Practice

## 2019-10-23 ENCOUNTER — Ambulatory Visit (HOSPITAL_COMMUNITY): Admission: RE | Admit: 2019-10-23 | Payer: No Typology Code available for payment source | Source: Ambulatory Visit

## 2019-10-23 ENCOUNTER — Other Ambulatory Visit (HOSPITAL_COMMUNITY): Payer: Self-pay | Admitting: Obstetrics and Gynecology

## 2019-10-23 ENCOUNTER — Other Ambulatory Visit (HOSPITAL_COMMUNITY): Payer: Self-pay | Admitting: *Deleted

## 2019-10-23 ENCOUNTER — Other Ambulatory Visit: Payer: Self-pay

## 2019-10-23 ENCOUNTER — Encounter (HOSPITAL_COMMUNITY): Payer: Self-pay

## 2019-10-23 ENCOUNTER — Ambulatory Visit (HOSPITAL_COMMUNITY): Payer: No Typology Code available for payment source | Admitting: *Deleted

## 2019-10-23 ENCOUNTER — Ambulatory Visit (HOSPITAL_COMMUNITY)
Admission: RE | Admit: 2019-10-23 | Discharge: 2019-10-23 | Disposition: A | Discharge status: Home / Self Care | Payer: No Typology Code available for payment source | Source: Ambulatory Visit | Attending: Obstetrics and Gynecology | Admitting: Obstetrics and Gynecology

## 2019-10-23 DIAGNOSIS — Z3A34 34 weeks gestation of pregnancy: Secondary | ICD-10-CM | POA: Diagnosis not present

## 2019-10-23 DIAGNOSIS — O365920 Maternal care for other known or suspected poor fetal growth, second trimester, not applicable or unspecified: Secondary | ICD-10-CM

## 2019-10-23 DIAGNOSIS — Z362 Encounter for other antenatal screening follow-up: Secondary | ICD-10-CM

## 2019-10-23 DIAGNOSIS — O0990 Supervision of high risk pregnancy, unspecified, unspecified trimester: Secondary | ICD-10-CM | POA: Diagnosis present

## 2019-10-23 DIAGNOSIS — O365930 Maternal care for other known or suspected poor fetal growth, third trimester, not applicable or unspecified: Secondary | ICD-10-CM | POA: Diagnosis not present

## 2019-10-23 DIAGNOSIS — O365990 Maternal care for other known or suspected poor fetal growth, unspecified trimester, not applicable or unspecified: Secondary | ICD-10-CM

## 2019-10-30 ENCOUNTER — Encounter (HOSPITAL_COMMUNITY): Payer: Self-pay

## 2019-10-30 ENCOUNTER — Other Ambulatory Visit: Payer: Self-pay

## 2019-10-30 ENCOUNTER — Ambulatory Visit (HOSPITAL_COMMUNITY): Admission: RE | Admit: 2019-10-30 | Payer: No Typology Code available for payment source | Source: Ambulatory Visit

## 2019-10-30 ENCOUNTER — Ambulatory Visit (HOSPITAL_COMMUNITY)
Admission: RE | Admit: 2019-10-30 | Discharge: 2019-10-30 | Disposition: A | Discharge status: Home / Self Care | Payer: No Typology Code available for payment source | Source: Ambulatory Visit | Attending: Obstetrics and Gynecology | Admitting: Obstetrics and Gynecology

## 2019-10-30 ENCOUNTER — Ambulatory Visit (HOSPITAL_COMMUNITY): Payer: No Typology Code available for payment source | Admitting: *Deleted

## 2019-10-30 DIAGNOSIS — O0990 Supervision of high risk pregnancy, unspecified, unspecified trimester: Secondary | ICD-10-CM | POA: Insufficient documentation

## 2019-10-30 DIAGNOSIS — O365930 Maternal care for other known or suspected poor fetal growth, third trimester, not applicable or unspecified: Secondary | ICD-10-CM

## 2019-10-30 DIAGNOSIS — O365990 Maternal care for other known or suspected poor fetal growth, unspecified trimester, not applicable or unspecified: Secondary | ICD-10-CM | POA: Insufficient documentation

## 2019-10-30 DIAGNOSIS — Z3A35 35 weeks gestation of pregnancy: Secondary | ICD-10-CM | POA: Diagnosis not present

## 2019-11-06 ENCOUNTER — Encounter (HOSPITAL_COMMUNITY): Payer: Self-pay

## 2019-11-06 ENCOUNTER — Ambulatory Visit (HOSPITAL_COMMUNITY)
Admission: RE | Admit: 2019-11-06 | Discharge: 2019-11-06 | Disposition: A | Discharge status: Home / Self Care | Payer: No Typology Code available for payment source | Source: Ambulatory Visit | Attending: Obstetrics and Gynecology | Admitting: Obstetrics and Gynecology

## 2019-11-06 ENCOUNTER — Other Ambulatory Visit (HOSPITAL_COMMUNITY): Payer: Self-pay | Admitting: *Deleted

## 2019-11-06 ENCOUNTER — Ambulatory Visit (HOSPITAL_COMMUNITY): Payer: No Typology Code available for payment source | Admitting: *Deleted

## 2019-11-06 ENCOUNTER — Other Ambulatory Visit: Payer: Self-pay

## 2019-11-06 ENCOUNTER — Other Ambulatory Visit (HOSPITAL_COMMUNITY): Payer: Non-veteran care

## 2019-11-06 DIAGNOSIS — O0990 Supervision of high risk pregnancy, unspecified, unspecified trimester: Secondary | ICD-10-CM

## 2019-11-06 DIAGNOSIS — Z362 Encounter for other antenatal screening follow-up: Secondary | ICD-10-CM

## 2019-11-06 DIAGNOSIS — O365930 Maternal care for other known or suspected poor fetal growth, third trimester, not applicable or unspecified: Secondary | ICD-10-CM

## 2019-11-06 DIAGNOSIS — O365990 Maternal care for other known or suspected poor fetal growth, unspecified trimester, not applicable or unspecified: Secondary | ICD-10-CM

## 2019-11-06 DIAGNOSIS — Z3A36 36 weeks gestation of pregnancy: Secondary | ICD-10-CM | POA: Diagnosis not present

## 2019-11-13 ENCOUNTER — Encounter (HOSPITAL_COMMUNITY): Payer: Self-pay

## 2019-11-13 ENCOUNTER — Ambulatory Visit (HOSPITAL_COMMUNITY)
Admission: RE | Admit: 2019-11-13 | Discharge: 2019-11-13 | Disposition: A | Discharge status: Home / Self Care | Payer: No Typology Code available for payment source | Source: Ambulatory Visit | Attending: Obstetrics and Gynecology | Admitting: Obstetrics and Gynecology

## 2019-11-13 ENCOUNTER — Ambulatory Visit (HOSPITAL_COMMUNITY): Payer: No Typology Code available for payment source | Admitting: *Deleted

## 2019-11-13 ENCOUNTER — Other Ambulatory Visit: Payer: Self-pay

## 2019-11-13 DIAGNOSIS — O365930 Maternal care for other known or suspected poor fetal growth, third trimester, not applicable or unspecified: Secondary | ICD-10-CM

## 2019-11-13 DIAGNOSIS — O0990 Supervision of high risk pregnancy, unspecified, unspecified trimester: Secondary | ICD-10-CM | POA: Insufficient documentation

## 2019-11-13 DIAGNOSIS — Z362 Encounter for other antenatal screening follow-up: Secondary | ICD-10-CM

## 2019-11-13 DIAGNOSIS — Z3A37 37 weeks gestation of pregnancy: Secondary | ICD-10-CM

## 2019-11-13 DIAGNOSIS — O365990 Maternal care for other known or suspected poor fetal growth, unspecified trimester, not applicable or unspecified: Secondary | ICD-10-CM | POA: Insufficient documentation

## 2019-12-03 ENCOUNTER — Encounter: Payer: Self-pay | Admitting: Lactation Services

## 2019-12-03 ENCOUNTER — Other Ambulatory Visit: Payer: Self-pay | Admitting: Obstetrics & Gynecology

## 2019-12-03 ENCOUNTER — Telehealth (INDEPENDENT_AMBULATORY_CARE_PROVIDER_SITE_OTHER): Payer: Non-veteran care | Admitting: Lactation Services

## 2019-12-03 ENCOUNTER — Telehealth: Payer: Self-pay | Admitting: Lactation Services

## 2019-12-03 ENCOUNTER — Telehealth: Payer: Self-pay

## 2019-12-03 ENCOUNTER — Other Ambulatory Visit: Payer: Self-pay | Admitting: Advanced Practice Midwife

## 2019-12-03 DIAGNOSIS — O0990 Supervision of high risk pregnancy, unspecified, unspecified trimester: Secondary | ICD-10-CM

## 2019-12-03 NOTE — Telephone Encounter (Signed)
Patient called in stating that she absolutely cannot make her induction tomorrow and stated that she would come in on Sunday night at midnight 21st if she didn't already have the baby by then. Called L&D and changed the patients dates and advised the patient  Of them she stated that they were fine and she would show up then.

## 2019-12-03 NOTE — Telephone Encounter (Signed)
Attempted to call pt. Left message for pt to call the office ASAP. Have sent My Chart message to pt.

## 2019-12-03 NOTE — Telephone Encounter (Signed)
Attempted to call patient to inform her that Dr. Macon Large would like for her to be induced in the next 2 days. Left message for patient to call the office at her earliest convenience.

## 2019-12-03 NOTE — Progress Notes (Signed)
Patient messaged office via My Chart to inform us that she was over [redacted] weeks gestation and wanted to know when her next appointment is to be.   Patient had last appointment by [facility] office on 12/15. She was to have a follow up in 2 weeks, it was not scheduled.   Pt was being followed by MFM for Fetal Growth Restriction and had last appt on 1/28. It was recommended that she follow up for BPP if not delivered by 39 weeks. MFM recommended induction at 39 weeks, pt declined and wished to wait until 40 weeks for induction.   Spoke with Dr. Macon Large who recommended that patient be induced today or tomorrow, Orders placed by Dr. Macon Large. Spoke with Para March in L&D, induction scheduled for tonight at midnight.   Called pt x 2 to inform her of Induction information. Left 2 messages and a My Chart message for pt. Will follow up later today.

## 2019-12-03 NOTE — Telephone Encounter (Signed)
Called patients Sister listed in chart. Left message to have [place] call the office as soon as she can.

## 2019-12-03 NOTE — Telephone Encounter (Signed)
Called patient back after getting My Chart message from patient and she reports she was wanting to wait to see if infant will come on his own. Patient is wanting to wait and give her self more time. She reports infant is moving around well and that she feels fine. Patient is asking that she wait to be induced on Friday.   Reviewed with patient that it is recommended since she is now post dates and infant was noted to have fetal growth restriction earlier in the pregnancy that she go in for induction tonight.  Pt voiced she will speak with her husband and call us back.   Spoke with Dr. Macon Large and she reports she wants to make sure patient is aware that post dates and Fetal Growth Restriction increases risk of fetal death. Patient was called again and informed of risks of waiting for delivery, pt voiced understanding.

## 2019-12-04 ENCOUNTER — Inpatient Hospital Stay (HOSPITAL_COMMUNITY): Payer: No Typology Code available for payment source

## 2019-12-04 ENCOUNTER — Telehealth (HOSPITAL_COMMUNITY): Payer: Self-pay | Admitting: *Deleted

## 2019-12-04 NOTE — Telephone Encounter (Signed)
Preadmission screen  

## 2019-12-05 ENCOUNTER — Telehealth (HOSPITAL_COMMUNITY): Payer: Self-pay | Admitting: *Deleted

## 2019-12-05 ENCOUNTER — Encounter (HOSPITAL_COMMUNITY): Payer: Self-pay | Admitting: *Deleted

## 2019-12-05 NOTE — Telephone Encounter (Signed)
Preadmission screen  

## 2019-12-06 ENCOUNTER — Other Ambulatory Visit (HOSPITAL_COMMUNITY)
Admission: RE | Admit: 2019-12-06 | Discharge: 2019-12-06 | Disposition: A | Discharge status: Home / Self Care | Payer: No Typology Code available for payment source | Source: Ambulatory Visit | Attending: Family Medicine | Admitting: Family Medicine

## 2019-12-06 DIAGNOSIS — Z01812 Encounter for preprocedural laboratory examination: Secondary | ICD-10-CM | POA: Insufficient documentation

## 2019-12-06 DIAGNOSIS — Z20822 Contact with and (suspected) exposure to covid-19: Secondary | ICD-10-CM | POA: Insufficient documentation

## 2019-12-06 LAB — SARS CORONAVIRUS 2 (TAT 6-24 HRS): SARS Coronavirus 2: NEGATIVE

## 2019-12-07 ENCOUNTER — Other Ambulatory Visit: Payer: Self-pay | Admitting: Family Medicine

## 2019-12-08 ENCOUNTER — Inpatient Hospital Stay (HOSPITAL_COMMUNITY): Payer: No Typology Code available for payment source | Admitting: Anesthesiology

## 2019-12-08 ENCOUNTER — Other Ambulatory Visit: Payer: Self-pay

## 2019-12-08 ENCOUNTER — Encounter (HOSPITAL_COMMUNITY): Payer: Self-pay | Admitting: Obstetrics and Gynecology

## 2019-12-08 ENCOUNTER — Inpatient Hospital Stay (HOSPITAL_COMMUNITY): Payer: No Typology Code available for payment source

## 2019-12-08 ENCOUNTER — Inpatient Hospital Stay (HOSPITAL_COMMUNITY)
Admission: AD | Admit: 2019-12-08 | Discharge: 2019-12-09 | DRG: 807 | Disposition: A | Discharge status: Home / Self Care | Payer: No Typology Code available for payment source | Attending: Obstetrics & Gynecology | Admitting: Obstetrics & Gynecology

## 2019-12-08 DIAGNOSIS — O365990 Maternal care for other known or suspected poor fetal growth, unspecified trimester, not applicable or unspecified: Secondary | ICD-10-CM

## 2019-12-08 DIAGNOSIS — Z20822 Contact with and (suspected) exposure to covid-19: Secondary | ICD-10-CM | POA: Diagnosis present

## 2019-12-08 DIAGNOSIS — Z3A41 41 weeks gestation of pregnancy: Secondary | ICD-10-CM

## 2019-12-08 DIAGNOSIS — O365930 Maternal care for other known or suspected poor fetal growth, third trimester, not applicable or unspecified: Secondary | ICD-10-CM | POA: Diagnosis present

## 2019-12-08 DIAGNOSIS — O480 Post-term pregnancy: Secondary | ICD-10-CM | POA: Diagnosis present

## 2019-12-08 DIAGNOSIS — O0990 Supervision of high risk pregnancy, unspecified, unspecified trimester: Secondary | ICD-10-CM

## 2019-12-08 HISTORY — DX: Maternal care for other known or suspected poor fetal growth, unspecified trimester, not applicable or unspecified: O36.5990

## 2019-12-08 LAB — TYPE AND SCREEN
ABO/RH(D): B POS
Antibody Screen: NEGATIVE

## 2019-12-08 LAB — GROUP B STREP BY PCR: Group B strep by PCR: NEGATIVE

## 2019-12-08 LAB — GC/CHLAMYDIA PROBE AMP (~~LOC~~) NOT AT ARMC
Chlamydia: NEGATIVE
Comment: NEGATIVE
Comment: NORMAL
Neisseria Gonorrhea: NEGATIVE

## 2019-12-08 LAB — CBC
HCT: 34 % — ABNORMAL LOW (ref 36.0–46.0)
Hemoglobin: 11.5 g/dL — ABNORMAL LOW (ref 12.0–15.0)
MCH: 31 pg (ref 26.0–34.0)
MCHC: 33.8 g/dL (ref 30.0–36.0)
MCV: 91.6 fL (ref 80.0–100.0)
Platelets: 215 10*3/uL (ref 150–400)
RBC: 3.71 MIL/uL — ABNORMAL LOW (ref 3.87–5.11)
RDW: 12.3 % (ref 11.5–15.5)
WBC: 10.5 10*3/uL (ref 4.0–10.5)
nRBC: 0 % (ref 0.0–0.2)

## 2019-12-08 LAB — ABO/RH: ABO/RH(D): B POS

## 2019-12-08 LAB — RPR: RPR Ser Ql: NONREACTIVE

## 2019-12-08 MED ORDER — SIMETHICONE 80 MG PO CHEW: 80.0000 mg | CHEWABLE_TABLET | ORAL | Status: DC | PRN

## 2019-12-08 MED ORDER — ACETAMINOPHEN 325 MG PO TABS: 650.0000 mg | ORAL_TABLET | ORAL | Status: DC | PRN

## 2019-12-08 MED ORDER — DIBUCAINE (PERIANAL) 1 % EX OINT: 1.0000 "application " | TOPICAL_OINTMENT | CUTANEOUS | Status: DC | PRN

## 2019-12-08 MED ORDER — MAGNESIUM HYDROXIDE 400 MG/5ML PO SUSP: 30.0000 mL | ORAL | Status: DC | PRN

## 2019-12-08 MED ORDER — IBUPROFEN 600 MG PO TABS
600.0000 mg | ORAL_TABLET | Freq: Four times a day (QID) | ORAL | Status: DC
  Administered 2019-12-08 – 2019-12-09 (×6): 600 mg via ORAL
  Filled 2019-12-08 (×6): qty 1

## 2019-12-08 MED ORDER — OXYTOCIN BOLUS FROM INFUSION
500.0000 mL | Freq: Once | INTRAVENOUS | Status: AC
  Administered 2019-12-08: 05:00:00 500 mL via INTRAVENOUS

## 2019-12-08 MED ORDER — ZOLPIDEM TARTRATE 5 MG PO TABS: 5.0000 mg | ORAL_TABLET | Freq: Every evening | ORAL | Status: DC | PRN

## 2019-12-08 MED ORDER — DIPHENHYDRAMINE HCL 50 MG/ML IJ SOLN: 12.5000 mg | INTRAMUSCULAR | Status: DC | PRN

## 2019-12-08 MED ORDER — DIPHENHYDRAMINE HCL 25 MG PO CAPS: 25.0000 mg | ORAL_CAPSULE | Freq: Four times a day (QID) | ORAL | Status: DC | PRN

## 2019-12-08 MED ORDER — OXYTOCIN 40 UNITS IN NORMAL SALINE INFUSION - SIMPLE MED: 1.0000 m[IU]/min | INTRAVENOUS | Status: DC

## 2019-12-08 MED ORDER — PHENYLEPHRINE 40 MCG/ML (10ML) SYRINGE FOR IV PUSH (FOR BLOOD PRESSURE SUPPORT)
80.0000 ug | PREFILLED_SYRINGE | INTRAVENOUS | Status: DC | PRN
  Filled 2019-12-08: qty 10

## 2019-12-08 MED ORDER — MISOPROSTOL 25 MCG QUARTER TABLET: 25.0000 ug | ORAL_TABLET | ORAL | Status: DC | PRN

## 2019-12-08 MED ORDER — LACTATED RINGERS IV SOLN: INTRAVENOUS | Status: DC

## 2019-12-08 MED ORDER — HYDROXYZINE HCL 50 MG PO TABS: 50.0000 mg | ORAL_TABLET | Freq: Four times a day (QID) | ORAL | Status: DC | PRN

## 2019-12-08 MED ORDER — OXYTOCIN 40 UNITS IN NORMAL SALINE INFUSION - SIMPLE MED: 2.5000 [IU]/h | INTRAVENOUS | Status: DC

## 2019-12-08 MED ORDER — LIDOCAINE HCL (PF) 1 % IJ SOLN
INTRAMUSCULAR | Status: DC | PRN
  Administered 2019-12-08: 8 mL via EPIDURAL

## 2019-12-08 MED ORDER — TERBUTALINE SULFATE 1 MG/ML IJ SOLN: 0.2500 mg | Freq: Once | INTRAMUSCULAR | Status: DC | PRN

## 2019-12-08 MED ORDER — ONDANSETRON HCL 4 MG/2ML IJ SOLN
4.0000 mg | Freq: Four times a day (QID) | INTRAMUSCULAR | Status: DC | PRN
  Administered 2019-12-08: 04:00:00 4 mg via INTRAVENOUS
  Filled 2019-12-08: qty 2

## 2019-12-08 MED ORDER — OXYCODONE-ACETAMINOPHEN 5-325 MG PO TABS: 2.0000 | ORAL_TABLET | ORAL | Status: DC | PRN

## 2019-12-08 MED ORDER — LACTATED RINGERS IV SOLN: 500.0000 mL | INTRAVENOUS | Status: DC | PRN

## 2019-12-08 MED ORDER — OXYTOCIN 40 UNITS IN NORMAL SALINE INFUSION - SIMPLE MED
1.0000 m[IU]/min | INTRAVENOUS | Status: DC
  Administered 2019-12-08: 02:00:00 2 m[IU]/min via INTRAVENOUS
  Filled 2019-12-08: qty 1000

## 2019-12-08 MED ORDER — PHENYLEPHRINE 40 MCG/ML (10ML) SYRINGE FOR IV PUSH (FOR BLOOD PRESSURE SUPPORT): 80.0000 ug | PREFILLED_SYRINGE | INTRAVENOUS | Status: DC | PRN

## 2019-12-08 MED ORDER — SOD CITRATE-CITRIC ACID 500-334 MG/5ML PO SOLN: 30.0000 mL | ORAL | Status: DC | PRN

## 2019-12-08 MED ORDER — ONDANSETRON HCL 4 MG PO TABS: 4.0000 mg | ORAL_TABLET | ORAL | Status: DC | PRN

## 2019-12-08 MED ORDER — LACTATED RINGERS IV SOLN: 500.0000 mL | Freq: Once | INTRAVENOUS | Status: DC

## 2019-12-08 MED ORDER — FLEET ENEMA 7-19 GM/118ML RE ENEM: 1.0000 | ENEMA | Freq: Every day | RECTAL | Status: DC | PRN

## 2019-12-08 MED ORDER — FENTANYL CITRATE (PF) 100 MCG/2ML IJ SOLN: 50.0000 ug | INTRAMUSCULAR | Status: DC | PRN

## 2019-12-08 MED ORDER — WITCH HAZEL-GLYCERIN EX PADS: 1.0000 "application " | MEDICATED_PAD | CUTANEOUS | Status: DC | PRN

## 2019-12-08 MED ORDER — LIDOCAINE HCL (PF) 1 % IJ SOLN: 30.0000 mL | INTRAMUSCULAR | Status: DC | PRN

## 2019-12-08 MED ORDER — EPHEDRINE 5 MG/ML INJ: 10.0000 mg | INTRAVENOUS | Status: DC | PRN

## 2019-12-08 MED ORDER — OXYCODONE HCL 5 MG PO TABS: 10.0000 mg | ORAL_TABLET | ORAL | Status: DC | PRN

## 2019-12-08 MED ORDER — ONDANSETRON HCL 4 MG/2ML IJ SOLN: 4.0000 mg | INTRAMUSCULAR | Status: DC | PRN

## 2019-12-08 MED ORDER — OXYCODONE HCL 5 MG PO TABS: 5.0000 mg | ORAL_TABLET | ORAL | Status: DC | PRN

## 2019-12-08 MED ORDER — BENZOCAINE-MENTHOL 20-0.5 % EX AERO
1.0000 "application " | INHALATION_SPRAY | CUTANEOUS | Status: DC | PRN
  Administered 2019-12-08: 1 via TOPICAL
  Filled 2019-12-08: qty 56

## 2019-12-08 MED ORDER — SENNOSIDES-DOCUSATE SODIUM 8.6-50 MG PO TABS
2.0000 | ORAL_TABLET | ORAL | Status: DC
  Administered 2019-12-08: 2 via ORAL
  Filled 2019-12-08: qty 2

## 2019-12-08 MED ORDER — SODIUM CHLORIDE (PF) 0.9 % IJ SOLN
INTRAMUSCULAR | Status: DC | PRN
  Administered 2019-12-08: 12 mL/h via EPIDURAL

## 2019-12-08 MED ORDER — COCONUT OIL OIL: 1.0000 "application " | TOPICAL_OIL | Status: DC | PRN

## 2019-12-08 MED ORDER — FENTANYL-BUPIVACAINE-NACL 0.5-0.125-0.9 MG/250ML-% EP SOLN
12.0000 mL/h | EPIDURAL | Status: DC | PRN
  Filled 2019-12-08: qty 250

## 2019-12-08 MED ORDER — PRENATAL MULTIVITAMIN CH
1.0000 | ORAL_TABLET | Freq: Every day | ORAL | Status: DC
  Administered 2019-12-08 – 2019-12-09 (×2): 1 via ORAL
  Filled 2019-12-08 (×2): qty 1

## 2019-12-08 MED ORDER — OXYCODONE-ACETAMINOPHEN 5-325 MG PO TABS: 1.0000 | ORAL_TABLET | ORAL | Status: DC | PRN

## 2019-12-08 NOTE — Lactation Note (Signed)
This note was copied from a baby's chart. Lactation Consultation Note  Patient Name: Sonya Cruz SYHNP'M Date: 12/08/2019 Reason for consult: Initial assessment G2 mom is an experienced breastfeeder.  Mom reports breastfed her first baby with no challenges.  Mom reports having a manual pump at home and a DEBP but does not like electric pump likes manual because she can control it. Urged to feed on demand/cue 8-12 times or more in 24 hours.  Reviewed [facility] breastfeeding Consultation Services Handouts.  Mom and infant STS.  Infant sleeping with no hunger cues.  Urged to call lactation as needed.  Maternal Data Has patient been taught Hand Expression?: Yes(patient reprots she knows how to h/e/not good at it) Does the patient have breastfeeding experience prior to this delivery?: Yes  Feeding Feeding Type: Breast Fed  LATCH Score                   Interventions Interventions: Breast feeding basics reviewed;Skin to skin;Hand express;Expressed milk  Lactation Tools Discussed/Used     Consult Status Consult Status: Follow-up Date: 12/09/19 Follow-up type: In-patient    [facility] Michaelle Copas 12/08/2019, 6:49 PM

## 2019-12-08 NOTE — Anesthesia Preprocedure Evaluation (Signed)
Anesthesia Evaluation  Patient identified by MRN, date of birth, ID band Patient awake    Reviewed: Allergy & Precautions, H&P , NPO status , Patient's Chart, lab work & pertinent test results, reviewed documented beta blocker date and time   Airway Mallampati: I  TM Distance: >3 FB Neck ROM: full    Dental no notable dental hx. (+) Teeth Intact, Dental Advisory Given   Pulmonary neg pulmonary ROS,    Pulmonary exam normal breath sounds clear to auscultation       Cardiovascular negative cardio ROS Normal cardiovascular exam Rhythm:regular Rate:Normal     Neuro/Psych negative neurological ROS  negative psych ROS   GI/Hepatic negative GI ROS, Neg liver ROS,   Endo/Other  negative endocrine ROS  Renal/GU negative Renal ROS  negative genitourinary   Musculoskeletal   Abdominal   Peds  Hematology negative hematology ROS (+)   Anesthesia Other Findings   Reproductive/Obstetrics (+) Pregnancy                             Anesthesia Physical Anesthesia Plan  ASA: II  Anesthesia Plan: Epidural   Post-op Pain Management:    Induction:   PONV Risk Score and Plan:   Airway Management Planned:   Additional Equipment:   Intra-op Plan:   Post-operative Plan:   Informed Consent: I have reviewed the patients History and Physical, chart, labs and discussed the procedure including the risks, benefits and alternatives for the proposed anesthesia with the patient or authorized representative who has indicated his/her understanding and acceptance.     Dental Advisory Given  Plan Discussed with:   Anesthesia Plan Comments: (Labs checked- platelets confirmed with RN in room. Fetal heart tracing, per RN, reported to be stable enough for sitting procedure. Discussed epidural, and patient consents to the procedure:  included risk of possible headache,backache, failed block, allergic reaction, and  nerve injury. This patient was asked if she had any questions or concerns before the procedure started.)        Anesthesia Quick Evaluation  

## 2019-12-08 NOTE — Discharge Summary (Signed)
Postpartum Discharge Summary    Patient Name: Sonya Cruz DOB: Dec 28, 1990 MRN: [id number]  Date of admission: 12/08/2019 Delivering Provider: Clarnce Flock   Date of discharge: 12/09/2019  Admitting diagnosis: Pregnancy affected by fetal growth restriction [O36.5990] Intrauterine pregnancy: [redacted]w[redacted]d    Secondary diagnosis:  Active Problems:   Supervision of high risk pregnancy, antepartum   Maternal care for restricted fetal growth, antepartum   Pregnancy affected by fetal growth restriction  Additional problems:      Discharge diagnosis: Term Pregnancy Delivered                                                                                                Post partum procedures:None  Augmentation: Pitocin  Complications: None  Hospital course:  Induction of Labor With Vaginal Delivery   29y.o. yo [id number] admitted to the hospital 12/08/2019 for induction of labor.  Indication for induction: Postdates and IUGR.  Patient had an uncomplicated labor course as follows: arrived at 3cm with favorable cervix and induced with pitocin, delivered approximately four hours after start of induction.  Membrane Rupture Time/Date: 2:55 AM ,12/08/2019   Intrapartum Procedures: Episiotomy: None [1]                                         Lacerations:  Periurethral [8]  Patient had delivery of a Viable infant.  Information for the patient's newborn:  MiBlakely, Maranan0[[id number]]    12/08/2019  Details of delivery can be found in separate delivery note.  Patient had a routine postpartum course. Patient is discharged home 12/09/19. Delivery time: 4:55 AM    Magnesium Sulfate received: No BMZ received: No Rhophylac:N/A MMR:N/A Transfusion:No  Physical exam  Vitals:   12/08/19 0740 12/08/19 1223 12/08/19 2000 12/09/19 0515  BP: 106/76 104/70 120/87 109/80  Pulse: 68 63 68 79  Resp: 18 16 18 16   Temp: 98 F (36.7 C) 97.9 F (36.6 C) 98.3 F (36.8 C) 98.3  F (36.8 C)  TempSrc: Oral Oral Oral Oral  SpO2: 99%  100% 99%  Weight:      Height:       General: alert, cooperative and no distress Lochia: appropriate Uterine Fundus: firm Incision: N/A DVT Evaluation: No evidence of DVT seen on physical exam. No significant calf/ankle edema. Labs: Lab Results  Component Value Date   WBC 10.5 12/08/2019   HGB 11.5 (L) 12/08/2019   HCT 34.0 (L) 12/08/2019   MCV 91.6 12/08/2019   PLT 215 12/08/2019   No flowsheet data found. Edinburgh Score: Edinburgh Postnatal Depression Scale Screening Tool 12/08/2019  I have been able to laugh and see the funny side of things. 0  I have looked forward with enjoyment to things. 0  I have blamed myself unnecessarily when things went wrong. 0  I have been anxious or worried for no good reason. 0  I have felt scared or panicky for no good reason. 0  Things have been getting  on top of me. 0  I have been so unhappy that I have had difficulty sleeping. 0  I have felt sad or miserable. 0  I have been so unhappy that I have been crying. 0  The thought of harming myself has occurred to me. 0  Edinburgh Postnatal Depression Scale Total 0    Discharge instruction: per After Visit Summary and "Baby and Me Booklet".  After visit meds:  Allergies as of 12/09/2019      Reactions   Shellfish Allergy Anaphylaxis      Medication List    STOP taking these medications   folic acid 728 MCG tablet Commonly known as: FOLVITE     TAKE these medications   acetaminophen 325 MG tablet Commonly known as: Tylenol Take 2 tablets (650 mg total) by mouth every 4 (four) hours as needed (for pain scale < 4).   ibuprofen 600 MG tablet Commonly known as: ADVIL Take 1 tablet (600 mg total) by mouth every 6 (six) hours.   polyethylene glycol powder 17 GM/SCOOP powder Commonly known as: GLYCOLAX/MIRALAX Take 255 g by mouth once for 1 dose.   prenatal multivitamin Tabs tablet Take 1 tablet by mouth daily at 12 noon.        Diet: routine diet  Activity: Advance as tolerated. Pelvic rest for 6 weeks.   Outpatient follow up:6 weeks Follow up Appt: Future Appointments  Date Time Provider [place]  01/13/2020  3:55 PM Nugent, Gerrie Nordmann, NP [facility]   Follow up Visit:   Please schedule this patient for Postpartum visit in: 6 weeks with the following provider: Any provider Virtual For C/S patients schedule nurse incision check in weeks 2 weeks: no High risk pregnancy complicated by: IUGR Delivery mode:  SVD Anticipated Birth Control:  POPs PP Procedures needed: none  Schedule Integrated BH visit: no   Newborn Data: Live born female  Birth Weight: 6 lb 13 oz (3090 g) APGAR: 56, 9  Newborn Delivery   Birth date/time: 12/08/2019 04:55:00 Delivery type: Vaginal, Spontaneous      Baby Feeding: Breast Disposition:home with mother   12/09/2019 Clarnce Flock, MD

## 2019-12-08 NOTE — H&P (Signed)
LABOR AND DELIVERY ADMISSION HISTORY AND PHYSICAL NOTE  Sonya Cruz is a 29 y.o. female G40P1011 with IUP at [redacted]w[redacted]d by LMP c/w 20wk [place] presenting for IOL for IUGR and PD.   She reports positive fetal movement. She denies leakage of fluid, vaginal bleeding, or contractions.   She plans on breast feeding. Her contraception plan is: POPs.  Prenatal History/Complications: PNC at [facility]:  @[redacted]w[redacted]d , CWD, normal anatomy, cephalic presentation, anterior placenta, 11%ile, EFW 2661 g  Pregnancy complications:  - IUGR, resolved - No PNV since 09/30/2019  Past Medical History: Past Medical History:  Diagnosis Date  . Abortion     Past Surgical History: Past Surgical History:  Procedure Laterality Date  . MULTIPLE TOOTH EXTRACTIONS      Obstetrical History: OB History    Gravida  3   Para  1   Term  1   Preterm  0   AB  1   Living  1     SAB  0   TAB  1   Ectopic  0   Multiple  0   Live Births  1           Social History: Social History   Socioeconomic History  . Marital status: Married    Spouse name: Not on file  . Number of children: Not on file  . Years of education: Not on file  . Highest education level: Not on file  Occupational History  . Not on file  Tobacco Use  . Smoking status: Never Smoker  . Smokeless tobacco: Never Used  Substance and Sexual Activity  . Alcohol use: No  . Drug use: No  . Sexual activity: Yes    Birth control/protection: None  Other Topics Concern  . Not on file  Social History Narrative   ** Merged History Encounter **       Social Determinants of Health   Financial Resource Strain:   . Difficulty of Paying Living Expenses: Not on file  Food Insecurity: No Food Insecurity  . Worried About Charity fundraiser in the Last Year: Never true  . Ran Out of Food in the Last Year: Never true  Transportation Needs: No Transportation Needs  . Lack of Transportation (Medical): No  . Lack of Transportation  (Non-Medical): No  Physical Activity:   . Days of Exercise per Week: Not on file  . Minutes of Exercise per Session: Not on file  Stress:   . Feeling of Stress : Not on file  Social Connections:   . Frequency of Communication with Friends and Family: Not on file  . Frequency of Social Gatherings with Friends and Family: Not on file  . Attends Religious Services: Not on file  . Active Member of Clubs or Organizations: Not on file  . Attends Archivist Meetings: Not on file  . Marital Status: Not on file    Family History: Family History  Problem Relation Age of Onset  . Cervical cancer Sister     Allergies: Allergies  Allergen Reactions  . Shellfish Allergy Anaphylaxis    Medications Prior to Admission  Medication Sig Dispense Refill Last Dose  . FOLIC ACID PO Take 1 tablet by mouth daily.   12/07/2019 at Unknown time  . Prenatal Vit-Fe Fumarate-FA (PRENATAL MULTIVITAMIN) TABS tablet Take 1 tablet by mouth daily at 12 noon.   12/07/2019 at Unknown time     Review of Systems  All systems reviewed and negative except as  stated in HPI  Physical Exam Blood pressure (!) 124/91, pulse 76, temperature 97.9 F (36.6 C), temperature source Oral, resp. rate 20, height 5\' 3"  (1.6 m), weight 65.2 kg, last menstrual period 02/23/2019, currently breastfeeding. General appearance: alert, oriented, NAD Lungs: normal respiratory effort Heart: regular rate Abdomen: soft, non-tender; gravid Extremities: No calf swelling or tenderness Presentation: cephalic by SVE  Fetal monitoringBaseline: 140 bpm, Variability: Good {> 6 bpm), Accelerations: Reactive and Decelerations: Absent Uterine activity: irregular  Dilation: 3.5 Effacement (%): 60 Station: -2 Exam by:: Dr. [email or web address]  Prenatal labs: ABO, Rh: B/Positive/-- (09/04 1127) Antibody: Negative (09/04 1127) Rubella: 2.89 (09/04 1127) RPR: Non Reactive (11/30 0831)  HBsAg: Negative (09/04 1127)  HIV: Non Reactive (11/30  0831)  GC/Chlamydia: neg/neg 07/21/2019, repeat pending  GBS:   unknown, rapid and culture pending 2-hr GTT: normal 09/15/2019 Genetic screening:  declined Anatomy 09/17/2019: FGR resolved, otherwise normal  Prenatal Transfer Tool  Maternal Diabetes: No Genetic Screening: Declined Maternal Ultrasounds/Referrals: IUGR Fetal Ultrasounds or other Referrals:  None Maternal Substance Abuse:  No Significant Maternal Medications:  None Significant Maternal Lab Results: None  No results found for this or any previous visit (from the past 24 hour(s)).  Patient Active Problem List   Diagnosis Date Noted  . Pregnancy affected by fetal growth restriction 12/08/2019  . Maternal care for restricted fetal growth, antepartum 09/15/2019  . Supervision of high risk pregnancy, antepartum 06/18/2019    Assessment: Sonya Cruz is a 29 y.o. G3P1011 at [redacted]w[redacted]d here for IOL for IUGR and PD.   #Labor: favorable cervix in multip. Perhaps could benefit from one misoprostol but given IUGR prefer to start pitocin 2x2. #Pain: IV pain meds PRN, epidural upon request #FWB: Cat I #GBS/ID: unknown, rapid and culture sent on admission #COVID: swab negative 12/06/2019 #MOF: Breast #MOC: POPs #Circ: yes, inpatient   12/08/2019 [facility] 12/08/2019, 1:25 AM

## 2019-12-08 NOTE — Anesthesia Procedure Notes (Signed)
Epidural Patient location during procedure: OB Start time: 12/08/2019 4:00 AM End time: 12/08/2019 4:05 AM  Staffing Anesthesiologist: Bethena Midget, MD  Preanesthetic Checklist Completed: patient identified, IV checked, site marked, risks and benefits discussed, surgical consent, monitors and equipment checked, pre-op evaluation and timeout performed  Epidural Patient position: sitting Prep: DuraPrep and site prepped and draped Patient monitoring: continuous pulse ox and blood pressure Approach: midline Location: L3-L4 Injection technique: LOR air  Needle:  Needle type: Tuohy  Needle gauge: 17 G Needle length: 9 cm and 9 Needle insertion depth: 5 cm cm Catheter type: closed end flexible Catheter size: 19 Gauge Catheter at skin depth: 10 cm Test dose: negative  Assessment Events: blood not aspirated, injection not painful, no injection resistance, no paresthesia and negative IV test

## 2019-12-08 NOTE — Anesthesia Postprocedure Evaluation (Signed)
Anesthesia Post Note  Patient: Sonya Cruz  Procedure(s) Performed: AN AD HOC LABOR EPIDURAL     Patient location during evaluation: Mother Baby Anesthesia Type: Epidural Level of consciousness: awake Pain management: satisfactory to patient Vital Signs Assessment: post-procedure vital signs reviewed and stable Respiratory status: spontaneous breathing Cardiovascular status: stable Anesthetic complications: no    Last Vitals:  Vitals:   12/08/19 0642 12/08/19 0740  BP: 109/77 106/76  Pulse: 79 68  Resp: 20 18  Temp: 36.7 C 36.7 C  SpO2: 100% 99%    Last Pain:  Vitals:   12/08/19 0740  TempSrc: Oral  PainSc: 0-No pain   Pain Goal:                   [employer]

## 2019-12-09 ENCOUNTER — Ambulatory Visit: Payer: Self-pay

## 2019-12-09 MED ORDER — IBUPROFEN 600 MG PO TABS: 600.0000 mg | ORAL_TABLET | Freq: Four times a day (QID) | ORAL | 0 refills | Status: AC

## 2019-12-09 MED ORDER — NORETHINDRONE 0.35 MG PO TABS: 1.0000 | ORAL_TABLET | Freq: Every day | ORAL | 11 refills | Status: AC

## 2019-12-09 MED ORDER — POLYETHYLENE GLYCOL 3350 17 GM/SCOOP PO POWD: 1.0000 | Freq: Once | ORAL | 0 refills | Status: AC

## 2019-12-09 MED ORDER — ACETAMINOPHEN 325 MG PO TABS: 650.0000 mg | ORAL_TABLET | ORAL | 0 refills | Status: AC | PRN

## 2019-12-09 NOTE — Lactation Note (Signed)
This note was copied from a baby's chart. Lactation Consultation Note  Patient Name: Sonya Cruz MVEHM'C Date: 12/09/2019 Reason for consult: Follow-up assessment;Infant weight loss;Term  LC in to visit with P2 Mom of term baby at 53 hrs old.  Baby lost 6.6%  In first day.  Baby noted to have 3 stools and 6 voids last 24 hrs and baby breastfed 9 times last 24 hrs.   Baby was circumcised this am.  Baby was spoon fed 5 ml of Mom's colostrum an hour ago.  Mom using hand pump, does not want to use double electric.  Encouraged baby to remain STS to encourage feedings at the breast.  Mom immediately unwrapped and placed baby STS on her chest.   Suck assessment done on finger- baby not creating a suction on finger as well, but after about 30 seconds, he became a bit more vigorous creating more suction.     Mom started using cradle hold with a blanket only under baby.  Offered to add pillow support and guided Mom's hands for a cross cradle hold to better control for a deeper latch.  Baby immediately opened his mouth wide and with assistance baby latched deeply.  Baby noted to have deep jaw extensions and swallows identified.  Mom denies any pinching or discomfort with baby's latch.  Mom taught to do alternate breast compression during sucking bursts to increase milk transfer.  Baby needing no stimulation to continue sucking.   Parents are wanting to go home, but understand why they are staying another day.  Talked with RN about weighing baby this afternoon.   Mom encouraged to continue to pump and offer extra colostrum by spoon.  Engorgement prevention and treatment reviewed.  Mom aware of OP lactation support.      Maternal Data    Feeding Feeding Type: Breast Fed  LATCH Score Latch: Grasps breast easily, tongue down, lips flanged, rhythmical sucking.  Audible Swallowing: Spontaneous and intermittent  Type of Nipple: Everted at rest and after stimulation  Comfort  (Breast/Nipple): Soft / non-tender  Hold (Positioning): Assistance needed to correctly position infant at breast and maintain latch.  LATCH Score: 9  Interventions Interventions: Breast feeding basics reviewed;Assisted with latch;Skin to skin;Breast massage;Hand express;Adjust position;Breast compression;Support pillows;Position options;Hand pump  Lactation Tools Discussed/Used Tools: Pump Breast pump type: Manual   Consult Status Consult Status: Follow-up Date: 12/10/19 Follow-up type: In-patient    Judee Clara 12/09/2019, 1:40 PM

## 2019-12-09 NOTE — Discharge Instructions (Signed)

## 2019-12-10 ENCOUNTER — Ambulatory Visit: Payer: Self-pay

## 2019-12-10 LAB — CULTURE, BETA STREP (GROUP B ONLY)

## 2019-12-10 NOTE — Lactation Note (Signed)
This note was copied from a baby's chart. Lactation Consultation Note:  Mother is a P2, infant is 69  hours old and is now at % wt loss. Mother attempting to latch infant when [facility] arrived to the room . Observed that infant was sleepy. Encouraged mother to rouse infant with STS and hand express colostrum. Infant lathc and tugged for several mins with a few swallows. Mother reports that she doesn't think he is really hunger now.  Mother  given comfort gels and a hand pump with instructions . Discussed treatment and prevention of engorgement. Mother to continue to cue base feed infant and feed at least 8-12 times or more in 24 hours and advised to allow for cluster feeding infant as needed.   Mother to continue to due STS. Mother is aware of available LC services at [facility], BFSG'S, OP Dept, and phone # for questions or concerns about breastfeeding.  Mother receptive to all teaching and plan of care.    Patient Name: Sonya Cruz NIOEV'O Date: 12/10/2019 Reason for consult: Follow-up assessment   Maternal Data    Feeding Feeding Type: Breast Fed  LATCH Score Latch: Grasps breast easily, tongue down, lips flanged, rhythmical sucking.  Audible Swallowing: A few with stimulation  Type of Nipple: Everted at rest and after stimulation  Comfort (Breast/Nipple): Soft / non-tender  Hold (Positioning): Assistance needed to correctly position infant at breast and maintain latch.  LATCH Score: 8  Interventions Interventions: Assisted with latch;Skin to skin;Hand express;Breast compression;Adjust position;Support pillows;Position options;Comfort gels;Hand pump  Lactation Tools Discussed/Used     Consult Status Consult Status: Complete    [name] 12/10/2019, 11:24 AM

## 2020-01-13 ENCOUNTER — Other Ambulatory Visit: Payer: Self-pay

## 2020-01-13 ENCOUNTER — Telehealth (INDEPENDENT_AMBULATORY_CARE_PROVIDER_SITE_OTHER): Payer: No Typology Code available for payment source | Admitting: Women's Health

## 2020-01-13 DIAGNOSIS — Z5329 Procedure and treatment not carried out because of patient's decision for other reasons: Secondary | ICD-10-CM

## 2020-01-13 NOTE — Progress Notes (Signed)
RN called patient x2 for virtual visit, no answer, pt will be asked to reschedule.  Marylen Ponto, NP  4:39 PM 01/13/2020

## 2020-01-13 NOTE — Progress Notes (Signed)
Called pt at 1556; VM left stating I was calling to check pt in for virtual appt and will call back in 15 minutes. Pt encouraged to call back or log into MyChart. Callback number given.   Called pt at [phone]; VM box is full, unable to leave a message.   Fleet Contras RN 01/13/20

## 2020-01-14 ENCOUNTER — Telehealth: Payer: Self-pay | Admitting: Family Medicine

## 2020-01-14 NOTE — Telephone Encounter (Signed)
Attempted to contact patient to get her missed postpartum visit rescheduled. No answer, left a voicemail with the new appointment information ( 4/6 @ 1:35 - virtual). Patient instructed to give the office a call back if she is needing to reschedule. Mychart message also sent

## 2020-01-20 ENCOUNTER — Telehealth (INDEPENDENT_AMBULATORY_CARE_PROVIDER_SITE_OTHER): Payer: Self-pay

## 2020-01-20 DIAGNOSIS — Z5329 Procedure and treatment not carried out because of patient's decision for other reasons: Secondary | ICD-10-CM

## 2020-01-20 NOTE — Progress Notes (Signed)
Called pt at 1327; VM left stating I am calling to check pt in for virtual post partum appt and will call back in 10 minutes. Call back number given. Per chart review, our office has not been able to reach patient by phone or My Chart since previously schedule PP appt on 01/13/20. Called pt contact, Sherril Croon, who states she has received a text message from the patient today and this is her correct contact information. I explained to [place] our office is trying to reach the pt for a post partum appt. Pt contact states she will reach out to the patient and explain we are trying to contact her and that she may answer my call or call the office to reschedule this appt.   Called pt at 1343; VM left stating pt will need to call the office to reschedule appt if she is not available for the appt at this time. Unsuccessful attempt to contact pt at [phone].  Fleet Contras RN 01/20/20

## 2020-01-28 ENCOUNTER — Telehealth (INDEPENDENT_AMBULATORY_CARE_PROVIDER_SITE_OTHER): Payer: Self-pay | Admitting: Certified Nurse Midwife

## 2020-01-28 ENCOUNTER — Telehealth: Payer: Self-pay | Admitting: Certified Nurse Midwife

## 2020-01-28 DIAGNOSIS — Z5329 Procedure and treatment not carried out because of patient's decision for other reasons: Secondary | ICD-10-CM

## 2020-01-28 DIAGNOSIS — Z91199 Patient's noncompliance with other medical treatment and regimen due to unspecified reason: Secondary | ICD-10-CM

## 2020-01-28 NOTE — Progress Notes (Signed)
10:18a- Called pt for My Chart visit, no answer, will retry in 10 to 15 minutes.    10:28a-2nd attempt, still no answer, pt will need to reschedule appointment.

## 2020-01-28 NOTE — Telephone Encounter (Signed)
Attempted to contact patient to get her rescheduled for her missed postpartum visit. No answer, left voicemail for patient to give the office a call back to be rescheduled.  

## 2023-09-04 ENCOUNTER — Other Ambulatory Visit (HOSPITAL_COMMUNITY): Payer: Self-pay

## 2023-09-04 MED ORDER — AMOXICILLIN 875 MG PO TABS
875.0000 mg | ORAL_TABLET | Freq: Two times a day (BID) | ORAL | 0 refills | Status: AC
  Filled 2023-09-04: qty 20, 10d supply, fill #0
